# Patient Record
Sex: Female | Born: 1950
Health system: Southern US, Community
[De-identification: ages and names within clinical notes are randomized; demographics above are authoritative.]

## PROBLEM LIST (undated history)

## (undated) DIAGNOSIS — Z8542 Personal history of malignant neoplasm of other parts of uterus: Secondary | ICD-10-CM

## (undated) DIAGNOSIS — E079 Disorder of thyroid, unspecified: Secondary | ICD-10-CM

## (undated) DIAGNOSIS — E119 Type 2 diabetes mellitus without complications: Secondary | ICD-10-CM

## (undated) DIAGNOSIS — M1990 Unspecified osteoarthritis, unspecified site: Secondary | ICD-10-CM

## (undated) DIAGNOSIS — N3281 Overactive bladder: Secondary | ICD-10-CM

## (undated) DIAGNOSIS — N950 Postmenopausal bleeding: Secondary | ICD-10-CM

## (undated) DIAGNOSIS — E785 Hyperlipidemia, unspecified: Secondary | ICD-10-CM

## (undated) DIAGNOSIS — I10 Essential (primary) hypertension: Secondary | ICD-10-CM

## (undated) HISTORY — DX: Essential (primary) hypertension: I10

## (undated) HISTORY — PX: CYSTOSCOPY: SUR368

## (undated) HISTORY — DX: Postmenopausal bleeding: N95.0

## (undated) HISTORY — DX: Hyperlipidemia, unspecified: E78.5

## (undated) HISTORY — PX: COLONOSCOPY: SHX174

## (undated) HISTORY — DX: Disorder of thyroid, unspecified: E07.9

## (undated) HISTORY — DX: Personal history of malignant neoplasm of other parts of uterus: Z85.42

## (undated) HISTORY — DX: Overactive bladder: N32.81

## (undated) HISTORY — DX: Type 2 diabetes mellitus without complications: E11.9

---

## 2004-05-13 ENCOUNTER — Ambulatory Visit: Payer: Self-pay | Admitting: Cardiology

## 2005-08-29 ENCOUNTER — Ambulatory Visit: Payer: Self-pay | Admitting: Endocrinology

## 2006-05-04 ENCOUNTER — Ambulatory Visit: Payer: Self-pay | Admitting: Endocrinology

## 2006-06-23 ENCOUNTER — Encounter: Admission: RE | Admit: 2006-06-23 | Discharge: 2006-06-23 | Payer: Self-pay | Admitting: Endocrinology

## 2006-08-09 ENCOUNTER — Ambulatory Visit: Payer: Self-pay | Admitting: Endocrinology

## 2006-09-08 ENCOUNTER — Ambulatory Visit: Payer: Self-pay | Admitting: Endocrinology

## 2006-09-16 ENCOUNTER — Encounter: Payer: Self-pay | Admitting: Endocrinology

## 2006-09-16 DIAGNOSIS — I10 Essential (primary) hypertension: Secondary | ICD-10-CM

## 2006-10-23 ENCOUNTER — Ambulatory Visit: Payer: Self-pay | Admitting: Endocrinology

## 2006-10-23 LAB — CONVERTED CEMR LAB: TSH: 1.11 microintl units/mL (ref 0.35–5.50)

## 2006-10-24 ENCOUNTER — Encounter: Payer: Self-pay | Admitting: Endocrinology

## 2006-11-30 ENCOUNTER — Encounter: Payer: Self-pay | Admitting: Endocrinology

## 2006-12-01 ENCOUNTER — Telehealth: Payer: Self-pay | Admitting: Endocrinology

## 2007-01-24 ENCOUNTER — Ambulatory Visit: Payer: Self-pay | Admitting: Endocrinology

## 2007-01-24 DIAGNOSIS — E042 Nontoxic multinodular goiter: Secondary | ICD-10-CM

## 2007-01-24 DIAGNOSIS — E890 Postprocedural hypothyroidism: Secondary | ICD-10-CM

## 2007-01-25 ENCOUNTER — Encounter: Payer: Self-pay | Admitting: Endocrinology

## 2007-01-31 ENCOUNTER — Encounter: Payer: Self-pay | Admitting: Endocrinology

## 2007-04-24 ENCOUNTER — Ambulatory Visit: Payer: Self-pay | Admitting: Endocrinology

## 2008-03-13 ENCOUNTER — Ambulatory Visit: Payer: Self-pay | Admitting: Endocrinology

## 2008-03-13 LAB — CONVERTED CEMR LAB: TSH: 2.86 microintl units/mL (ref 0.35–5.50)

## 2009-01-29 ENCOUNTER — Encounter: Payer: Self-pay | Admitting: Endocrinology

## 2010-02-16 NOTE — Letter (Signed)
Summary: [facility]  Amy Leavy [facility]   Imported By: Sherian Rein 02/11/2009 11:02:54  _____________________________________________________________________  External Attachment:    Type:   Image     Comment:   External Document

## 2010-06-01 NOTE — Consult Note (Signed)
[facility]                          ENDOCRINOLOGY CONSULTATION   Hannah Barrett, Hannah Barrett                     MRN:          [id number]  DATE:09/08/2006                            DOB:          [birth year]    REASON FOR VISIT:  Follow up thyroid.   HISTORY OF PRESENT ILLNESS:  A 60 year old woman who is now 2-1/2 months  status post iodine-131 therapy for hyperthyroidism due to a multinodular  goiter.  She feels well except being slightly tired.   PAST MEDICAL HISTORY:  Hypertension.   REVIEW OF SYSTEMS:  Denies any change in he weight.   PHYSICAL EXAMINATION:  Blood pressure 141/73, heart rate 97, temperature  is 97.9, the weight is 231.  GENERAL:  No distress.  NECK:  Question of enlargement on the right side of the thyroid which is  slight if it is present at all.  NEUROLOGIC:  No tremor.   LABORATORY STUDIES:  September 08, 2006, TSH 0.23.   IMPRESSION:  1. Multinodular goiter.  2. Hyperthyroidism due to number 1, status post iodine-131 therapy      with some improvement in her TSH.   PLAN:  1. I have advised the patient that there is some improvement but she      is not euthyroid yet.  I also told her that in the setting of a      multinodular goiter I am uncertain if she would go from      hyperthyroid to euthyroid, or if she would go on to develop      hypothyroidism.  2. Return in about a month.     Sean A. Everardo All, MD  Electronically Signed    SAE/MedQ  DD: 09/12/2006  DT: 09/12/2006  Job #: [id number]   cc:   Fara Chute

## 2010-06-01 NOTE — Consult Note (Signed)
[facility]                          ENDOCRINOLOGY CONSULTATION   Hannah, Barrett                     MRN:          [id number]  DATE:08/09/2006                            DOB:          [birth year]    REASON FOR VISIT:  Followup thyroid.   HISTORY OF PRESENT ILLNESS:  A 60 year old woman who is now 6 weeks  status post Iodine-131 therapy for hyperthyroidism. She feels well  except for some fatigue.   PAST MEDICAL HISTORY:  Hypertension.   REVIEW OF SYSTEMS:  Denies any change in her weight.   PHYSICAL EXAMINATION:  VITAL SIGNS:  Blood pressure 149/85, heart rate  98, temperature 97.2, weight 235.  GENERAL:  No distress.  NECK:  Thyroid normal to my examination.   LABORATORY DATA:  Studies on August 09, 2006, free T4 normal at 0.9. TSH  slightly low at 0.14.   IMPRESSION:  1. Multi-nodular goiter.  2. Hyperthyroidism, due to multi-nodular goiter, persistent so far      after Iodine-131 therapy.   PLAN:  Return in a month.     Sean A. Everardo All, MD  Electronically Signed    SAE/MedQ  DD: 08/11/2006  DT: 08/13/2006  Job #: [id number]   cc:   Fara Chute

## 2010-06-04 NOTE — Consult Note (Signed)
[facility]                          ENDOCRINOLOGY CONSULTATION   Hannah, Barrett                     MRN:          [id number]  DATE:05/04/2006                            DOB:          22-Jan-1950    REASON FOR VISIT:  Follow up thyroid.   HISTORY OF PRESENT ILLNESS:  A 60 year old woman, who was found on  several occasions last year to have mild hyperthyroidism.  She states  she feels well except for fatigue.   FAMILY HISTORY:  She is uncertain about family history of thyroid  disease.   REVIEW OF SYSTEMS:  She has a lost a few pounds recently, but states she  is trying to lose.   PHYSICAL EXAMINATION:  VITAL SIGNS:  Blood pressure 129/74, heart rate  74, temperature is 97.1, the weight is 228.  GENERAL:  No distress.  SKIN:  Not diaphoretic.  NECK:  There is a question of fullness at the right lower pole, but I  cannot tell this for sure because any possible nodule there appears to  be substernal or subclavicular.   Laboratory studies forwarded by Dr. Reuel Boom:  On March 28, 2006, TSH  0.29.  On September 09, 2005, thyroid nuclear medicine scan shows a 24-hour  uptake low at 6%, and a question of increased activity at the right  lower pole.   IMPRESSION:  The most likely scenario is hereditary multinodular goiter  causing mild hyperthyroidism.   PLAN:  I discussed treatment options with her and told her that she  should repeat the scan and also have an ultrasound.  It is possible that  based on these results she may benefit from iodine-131 therapy.     Sean A. Everardo All, MD  Electronically Signed    SAE/MedQ  DD: 05/05/2006  DT: 05/05/2006  Job #: [id number]   cc:   Donzetta Sprung, M.D.

## 2010-06-04 NOTE — Consult Note (Signed)
[facility]                            ENDOCRINOLOGY CONSULTATION   Hannah Barrett, Hannah Barrett                     MRN:          [id number]  DATE:08/29/2005                            DOB:          Sep 03, 1950    REASON FOR REFERRAL:  Abnormal thyroid function studies.   HISTORY OF PRESENT ILLNESS:  [age]-year-old woman who states that in  February 2007, Dr. Neita Carp noted her to have, in her words, borderline  thyroid function studies, so he arranged for repeat studies in July.  Noting  these more abnormal, she was referred here.  Symptomatically, she feels well  except for minimal shortness of breath with exertion.   PAST MEDICAL HISTORY:  No previous history of thyroid disease.  She does  have a history of hypertension.   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   FAMILY HISTORY:  Negative for thyroid disease.   REVIEW OF SYSTEMS:  Denies the following:  Fever, weight gain, weight loss,  palpitations, itching and tremor.   PHYSICAL EXAMINATION:  VITAL SIGNS:  Blood pressure 128/76, heart rate 98.8.  Weight 246.  GENERAL:  No distress.  SKIN:  Not diaphoretic.  HEENT:  No proptosis.  No periorbital swelling.  NECK:  Thyroid is minimally and diffusely enlarged.  CHEST:  Clear to auscultation.  No respiratory distress.  CARDIOVASCULAR:  No JVD.  No edema.  Regular rate and rhythm.  No murmur.  NEUROLOGIC:  Alert and oriented.  Does not appear anxious or depressed and  there is no tremor.   LABORATORY DATA:  Laboratory studies forwarded by Dr. Neita Carp.  On August 16, 2005, TSH 0.12.   IMPRESSION:  1. Hyperthyroidism of uncertain etiology.  2. Minimal shortness of breath which is probably not related to #1.   PLAN:  1. We discussed the differential diagnosis and natural history of      hyperthyroidism, as well as the risks to her health.  2. Check thyroid nuclear medicine scan at [facility].  3. I  will let her know by phone about the results and will decide on a      plan from there.  4. I have told her that she could make an appointment with Dr. Neita Carp if      she wants to have the shortness of breath evaluated.                                   Sean A. Everardo All, MD   SAE/MedQ  DD:  08/30/2005  DT:  08/30/2005  Job #:  [id number]   cc:   Fara Chute

## 2011-02-11 ENCOUNTER — Ambulatory Visit: Payer: Self-pay | Admitting: Endocrinology

## 2014-01-13 ENCOUNTER — Encounter: Payer: BC Managed Care – PPO | Attending: "Endocrinology | Admitting: Nutrition

## 2014-01-13 VITALS — Ht 68.0 in | Wt 244.5 lb

## 2014-01-13 DIAGNOSIS — R7303 Prediabetes: Secondary | ICD-10-CM

## 2014-01-13 DIAGNOSIS — R7309 Other abnormal glucose: Secondary | ICD-10-CM | POA: Insufficient documentation

## 2014-01-13 DIAGNOSIS — E669 Obesity, unspecified: Secondary | ICD-10-CM

## 2014-01-13 DIAGNOSIS — Z713 Dietary counseling and surveillance: Secondary | ICD-10-CM | POA: Diagnosis not present

## 2014-01-13 NOTE — Patient Instructions (Addendum)
Goals:  Follow Diabetes Meal Plan as instructed  The Plate Method  Eat 3 meals about the same time daily.  Don't skip meals.  Limit carbohydrate intake to 30-45 grams carbohydrate/meal  Add lean protein foods to meals  Aim for 30 mins of physical activity three times per week.  Bring food record   Avoid snacks between meals  Cut out the salt shaker .Lose 5 lbs within 2 pounds.

## 2014-01-13 NOTE — Progress Notes (Signed)
  Medical Nutrition Therapy:  Appt start time: 1330 end time:  1430.   Assessment:  Primary concerns today:  Pre-Diabetes. Has had prediabetes for 5+ years. Lives with her husband. Metformin 500 mg once a day. Activity: Not much. Currently works fulltime as Web designer.    Preferred Learning Style:     No preference indicated   Learning Readiness:     Ready  Change in progress   MEDICATIONS: See list   DIETARY INTAKE:  Eats inconsistently. Skips breakfast usually. Uses a lot of salt when cooking and eating. Stress eats. Has late night snacks of cheese and other things. Drinks unsweet tea and water some. Not getting any exercise right now. Admits to not eating balanced meals.  Usual physical activity: ADL's  Estimated energy needs: 1500 calories 170 g carbohydrates 112 g protein 42 g fat  Progress Towards Goal(s):  In progress.   Nutritional Diagnosis:  NB-1.1 Food and nutrition-related knowledge deficit As related to Prediabetes.  As evidenced by A1C 6.3%.    Intervention:  Nutrition counseling on prediabetes, weight loss, meal planning and preventing complications from DM. Goals:  Follow Diabetes Meal Plan as instructed  The Plate Method  Eat 3 meals about the same time daily.  Don't skip meals.  Limit carbohydrate intake to 30-45 grams carbohydrate/meal  Add lean protein foods to meals  Aim for 30 mins of physical activity three times per week.  Bring food record   Avoid snacks between meals  Cut out the salt shaker .Lose 5 lbs within 2 months.   Teaching Method Utilized:  Visual Auditory Hands on  Handouts given during visit include: Living Well with Diabetes Carb Counting and Food Label handouts Meal Plan Card  Barriers to learning/adherence to lifestyle change: none  Demonstrated degree of understanding via:  Teach Back   Monitoring/Evaluation:  Dietary intake, exercise, meal planning, and body weight in 2 month(s).

## 2014-03-17 ENCOUNTER — Ambulatory Visit: Payer: BC Managed Care – PPO | Admitting: Nutrition

## 2014-05-30 LAB — HEMOGLOBIN A1C: Hgb A1c MFr Bld: 6 % (ref 4.0–6.0)

## 2014-07-08 ENCOUNTER — Ambulatory Visit: Payer: Self-pay | Admitting: Urology

## 2014-08-06 ENCOUNTER — Encounter: Payer: BLUE CROSS/BLUE SHIELD | Attending: General Practice | Admitting: Nutrition

## 2014-08-06 ENCOUNTER — Encounter: Payer: Self-pay | Admitting: Nutrition

## 2014-08-06 VITALS — Ht 69.0 in | Wt 259.4 lb

## 2014-08-06 DIAGNOSIS — R739 Hyperglycemia, unspecified: Secondary | ICD-10-CM

## 2014-08-06 DIAGNOSIS — E669 Obesity, unspecified: Secondary | ICD-10-CM

## 2014-08-06 NOTE — Progress Notes (Signed)
  Medical Nutrition Therapy:  Appt start time: 1200 end time:  1215.  Assessment:  Primary concerns today:  Pre-Diabetes follow up. She notes she lost her dad a few months ago and fell off the wagon. She notes she is eating more salads and some fruit. Still working on cutting out snacks which is her biggest issue along with portion control. Diet is low in fresh fruits, whole grains and high fiber foods. Drinking cranberry juice mostly but says it's for UTI. Retired in May. Not exercising much but willing to get started again. Has equipment at home but isn't using. Has gained some weight since last visit. Possible error weight of 244 lbs last visit. She think's it was 254 lbs.    Preferred Learning Style:     No preference indicated   Learning Readiness:     Ready  Change in progress   MEDICATIONS: See list   DIETARY INTAKE:  B) Raisin brain with 1% milk. Snack: none L) leftovers: broccoli, bbq chicken, Cranberry juice diet.  Snack: oreos 2 Cranberry juice D) LEfto over chicken, broccoli, baby limas and mac and cheese.   Eats inconsistently. Skips breakfast usually. Uses a lot of salt when cooking and eating. Stress eats. Has late night snacks of cheese and other things. Drinks unsweet tea and water some. Not getting any exercise right now. Admits to not eating balanced meals.  Usual physical activity: ADL's  Estimated energy needs: 1500 calories 170 g carbohydrates 112 g protein 42 g fat  Progress Towards Goal(s):  In progress.   Nutritional Diagnosis:  NB-1.1 Food and nutrition-related knowledge deficit As related to Prediabetes.  As evidenced by A1C 6.3%.    Intervention:  Nutrition counseling on prediabetes, weight loss, meal planning and preventing complications from DM. Need for increased physical activity, cutting out snacks and Cranberry juice Goals:  Follow Diabetes Meal Plan as instructed  Cut out Cranberry juice and only drink water  Increase low carb  vegetables to 2 svg per meal  Choose 1 piece of fruit with each meals.  Exercise 30 minutes 5 days per week.  Lose 1-2 lbs per week.   Lose 10 lbs by next visit.  Measure foods out for better portion control.  Teaching Method Utilized:  Visual Auditory Hands on  Handouts given during visit include: Living Well with Diabetes Carb Counting and Food Label handouts Meal Plan Card  Barriers to learning/adherence to lifestyle change: none  Demonstrated degree of understanding via:  Teach Back   Monitoring/Evaluation:  Dietary intake, exercise, meal planning, and body weight in 2 month(s).

## 2014-08-06 NOTE — Patient Instructions (Addendum)
Goals:  Follow Diabetes Meal Plan as instructed  Cut out Cranberry juice and only drink water  Increase low carb vegetables to 2 svg per meal  Choose 1 piece of fruit with each meals.  Exercise 30 minutes 5 days per week.  Cut out snacks!!   Lose 10 lbs by next visit.  Measure foods out for better portion control.

## 2014-10-08 LAB — HM DIABETES EYE EXAM

## 2014-10-16 ENCOUNTER — Other Ambulatory Visit: Payer: Self-pay | Admitting: "Endocrinology

## 2014-10-16 ENCOUNTER — Encounter: Payer: Self-pay | Admitting: "Endocrinology

## 2014-10-29 ENCOUNTER — Encounter (INDEPENDENT_AMBULATORY_CARE_PROVIDER_SITE_OTHER): Payer: Self-pay | Admitting: *Deleted

## 2014-11-06 ENCOUNTER — Encounter: Payer: Self-pay | Admitting: Nutrition

## 2014-11-06 ENCOUNTER — Encounter: Payer: BLUE CROSS/BLUE SHIELD | Attending: General Practice | Admitting: Nutrition

## 2014-11-06 VITALS — Ht 69.0 in | Wt 262.0 lb

## 2014-11-06 DIAGNOSIS — R739 Hyperglycemia, unspecified: Secondary | ICD-10-CM

## 2014-11-06 NOTE — Progress Notes (Signed)
  Medical Nutrition Therapy:  Appt start time: 1330 end time:  1400 Assessment:  Primary concerns today:  Pre-Diabetes follow up.  Some days are good .  Has been cutting down on using salt in cooking. Cut out carbonated drinks,  And drinking only unsweet tea. Portion sizes remain an issue. Still snacking at times. Physical activity: Doing remodeling of the house and that is limiting her time to exercise. Living with her daughter and family for right now due to remodeling. She is making better food choices overall. Doesn't drink enough water.  No weight loss; gained 1 lb.   Wt Readings from Last 3 Encounters:  11/06/14 262 lb (118.842 kg)  06/06/14 261 lb (118.389 kg)  08/06/14 259 lb 6.4 oz (117.663 kg)   Ht Readings from Last 3 Encounters:  11/06/14 '5\' 9"'$  (1.753 m)  06/06/14 '5\' 9"'$  (1.753 m)  08/06/14 '5\' 9"'$  (1.753 m)   Body mass index is 38.67 kg/(m^2).  Lab Results  Component Value Date   HGBA1C 6.0 05/30/2014   Preferred Learning Style:   No preference indicated   Learning Readiness:     Ready  Change in progress  MEDICATIONS: See list   DIETARY INTAKE:  B) Raisin brain with 1% milk or grape nuts 1/2 c. Snack: none L) can't remember what she ate D) Boneless pork chop, green beans,  Fried potatoes and onions, Unsweet tea  Usual physical activity: ADL's  Estimated energy needs: 1500 calories 170 g carbohydrates 112 g protein 42 g fat  Progress Towards Goal(s):  In progress.   Nutritional Diagnosis:  NB-1.1 Food and nutrition-related knowledge deficit As related to Prediabetes.  As evidenced by A1C 6.3%.    Intervention:  Nutrition counseling on prediabetes, weight loss, meal planning and preventing complications from DM. Need for increased physical activity, cutting out snacks and Cranberry juice Goals:  Follow Diabetes Meal Plan as instructed   Increase walking to 30 minutes three days a week.  Drink 5 bottles of water per day.  Put some reminder in the  house to remind of water.  Lose 1 lb per week.  \Teaching Method Utilized:  Visual Auditory Hands on  Handouts given during visit include: Living Well with Diabetes Carb Counting and Food Label handouts Meal Plan Card  Barriers to learning/adherence to lifestyle change: none  Demonstrated degree of understanding via:  Teach Back   Monitoring/Evaluation:  Dietary intake, exercise, meal planning, and body weight in 3 month(s).

## 2014-11-06 NOTE — Patient Instructions (Addendum)
Goals:  Follow Diabetes Meal Plan as instructed   Increase walking to 30 minutes three days a week.  Drink 5 bottles of water per day.  Put some reminder in the house to remind of water.  Lose 1 lb per week or 5 lbs by next visit.

## 2014-11-12 ENCOUNTER — Other Ambulatory Visit: Payer: Self-pay | Admitting: "Endocrinology

## 2014-11-12 DIAGNOSIS — E1165 Type 2 diabetes mellitus with hyperglycemia: Secondary | ICD-10-CM

## 2014-11-12 DIAGNOSIS — E032 Hypothyroidism due to medicaments and other exogenous substances: Secondary | ICD-10-CM

## 2014-11-12 DIAGNOSIS — IMO0002 Reserved for concepts with insufficient information to code with codable children: Secondary | ICD-10-CM

## 2014-11-12 DIAGNOSIS — E1169 Type 2 diabetes mellitus with other specified complication: Principal | ICD-10-CM

## 2014-11-18 ENCOUNTER — Encounter: Payer: Self-pay | Admitting: Adult Health

## 2014-11-18 ENCOUNTER — Ambulatory Visit (INDEPENDENT_AMBULATORY_CARE_PROVIDER_SITE_OTHER): Payer: BLUE CROSS/BLUE SHIELD | Admitting: Adult Health

## 2014-11-18 VITALS — BP 140/60 | HR 78 | Ht 69.0 in | Wt 270.0 lb

## 2014-11-18 DIAGNOSIS — N950 Postmenopausal bleeding: Secondary | ICD-10-CM

## 2014-11-18 HISTORY — DX: Postmenopausal bleeding: N95.0

## 2014-11-18 NOTE — Patient Instructions (Addendum)
Postmenopausal Bleeding Postmenopausal bleeding is any bleeding a woman has after she has entered into menopause. Menopause is the end of a woman's fertile years. After menopause, a woman no longer ovulates or has menstrual periods.  Postmenopausal bleeding can be caused by various things. Any type of postmenopausal bleeding, even if it appears to be a typical menstrual period, is concerning. This should be evaluated by your health care provider. Any treatment will depend on the cause of the bleeding. HOME CARE INSTRUCTIONS Monitor your condition for any changes. The following actions may help to alleviate any discomfort you are experiencing:  Avoid the use of tampons and douches as directed by your health care provider.  Change your pads frequently.  Get regular pelvic exams and Pap tests.  Keep all follow-up appointments for diagnostic tests as directed by your health care provider. SEEK MEDICAL CARE IF:   Your bleeding lasts more than 1 week.  You have abdominal pain.  You have bleeding with sexual intercourse. SEEK IMMEDIATE MEDICAL CARE IF:   You have a fever, chills, headache, dizziness, muscle aches, and bleeding.  You have severe pain with bleeding.  You are passing blood clots.  You have bleeding and need more than 1 pad an hour.  You feel faint. MAKE SURE YOU:  Understand these instructions.  Will watch your condition.  Will get help right away if you are not doing well or get worse.   This information is not intended to replace advice given to you by your health care provider. Make sure you discuss any questions you have with your health care provider.   Document Released: 04/13/2005 Document Revised: 10/24/2012 Document Reviewed: 08/02/2012 Elsevier Interactive Patient Education [employer]. Return 11/7 for gyn [place]

## 2014-11-18 NOTE — Progress Notes (Signed)
Subjective:     Patient ID: Hannah Barrett, female   DOB: Oct 11, 1950, 64 y.o.   MRN: [id number]  HPI Hannah Barrett is a 64 year old white female, married in for ?PMB, had seen some blood,not sure if vaginal or in urine is taking meds for UTI,but wanted to be checked, has seen Hannah Mends PA at [facility] and treated for UTI and has seen  Dr Diona Fanti for OAB.  Review of Systems +vaginal bleeding,has history of blood in urine and OAB,all other systems negative Reviewed past medical,surgical, social and family history. Reviewed medications and allergies.     Objective:   Physical Exam BP 140/60 mmHg  Pulse 78  Ht '5\' 9"'$  (1.753 m)  Wt 270 lb (122.471 kg)  BMI 39.85 kg/m2 Skin warm and dry.Pelvic: external genitalia is normal in appearance no lesions, vagina: has decreased color, moisture and rugae,urethra has no lesions or masses noted, cervix:smooth and deviated to the left, uterus: normal size, shape and contour, non tender, no masses felt, adnexa: no masses or tenderness noted. Bladder is non tender and no masses felt. Discussed will get [place] to assess endometrial lining and if thickened may need biopsy.Face time 20 minutes with 50% counseling.    Assessment:     PMB    Plan:     Return 11/7 for GYN US,will talk when results in Review handout on PMB

## 2014-11-24 ENCOUNTER — Telehealth: Payer: Self-pay | Admitting: Adult Health

## 2014-11-24 ENCOUNTER — Ambulatory Visit (INDEPENDENT_AMBULATORY_CARE_PROVIDER_SITE_OTHER): Payer: BLUE CROSS/BLUE SHIELD

## 2014-11-24 DIAGNOSIS — N950 Postmenopausal bleeding: Secondary | ICD-10-CM | POA: Diagnosis not present

## 2014-11-24 NOTE — Telephone Encounter (Signed)
Left message to call about [place]

## 2014-11-24 NOTE — Progress Notes (Signed)
PELVIC US TA/TV: homogenous anteverted uterus,normal lt ov, rt ov contains a  10 x 7.3 x 8.1 cm complex cyst w/mult thin septations,thick EEC 21m

## 2014-11-25 ENCOUNTER — Telehealth: Payer: Self-pay | Admitting: Adult Health

## 2014-11-25 DIAGNOSIS — N83201 Unspecified ovarian cyst, right side: Secondary | ICD-10-CM

## 2014-11-25 NOTE — Telephone Encounter (Signed)
Pt aware Hannah Barrett showed 15 mm endometrial lining and large complex cyst on right ovary, will check CA 125 today and get endometrial biopsy 11/10 at 11:45 with Dr Elonda Husky and if all normal will get F/U Hannah Barrett in 6-12 weeks for stability of cyst,as she has no pain with it.

## 2014-11-26 ENCOUNTER — Telehealth: Payer: Self-pay | Admitting: Adult Health

## 2014-11-26 DIAGNOSIS — N838 Other noninflammatory disorders of ovary, fallopian tube and broad ligament: Secondary | ICD-10-CM

## 2014-11-26 DIAGNOSIS — R971 Elevated cancer antigen 125 [CA 125]: Secondary | ICD-10-CM

## 2014-11-26 DIAGNOSIS — N950 Postmenopausal bleeding: Secondary | ICD-10-CM

## 2014-11-26 LAB — CA 125: CA 125: 238.4 U/mL — ABNORMAL HIGH (ref 0.0–38.1)

## 2014-11-26 NOTE — Telephone Encounter (Signed)
Pt aware of elevated CA 125 will get CT abd/pelvis with CONTRAST 11/10 at 7 pm at [facility]  Discussed with Dr Elonda Husky will cancel appt with him for tomorrow and decide F/U after CT results back

## 2014-11-27 ENCOUNTER — Ambulatory Visit (HOSPITAL_COMMUNITY)
Admission: RE | Admit: 2014-11-27 | Discharge: 2014-11-27 | Disposition: A | Discharge status: Home / Self Care | Payer: BLUE CROSS/BLUE SHIELD | Source: Ambulatory Visit | Attending: Adult Health | Admitting: Adult Health

## 2014-11-27 ENCOUNTER — Other Ambulatory Visit: Payer: BLUE CROSS/BLUE SHIELD | Admitting: Obstetrics & Gynecology

## 2014-11-27 DIAGNOSIS — N83201 Unspecified ovarian cyst, right side: Secondary | ICD-10-CM | POA: Diagnosis not present

## 2014-11-27 DIAGNOSIS — N200 Calculus of kidney: Secondary | ICD-10-CM | POA: Diagnosis not present

## 2014-11-27 DIAGNOSIS — N950 Postmenopausal bleeding: Secondary | ICD-10-CM

## 2014-11-27 DIAGNOSIS — K8020 Calculus of gallbladder without cholecystitis without obstruction: Secondary | ICD-10-CM | POA: Insufficient documentation

## 2014-11-27 DIAGNOSIS — R971 Elevated cancer antigen 125 [CA 125]: Secondary | ICD-10-CM

## 2014-11-27 DIAGNOSIS — N839 Noninflammatory disorder of ovary, fallopian tube and broad ligament, unspecified: Secondary | ICD-10-CM | POA: Diagnosis present

## 2014-11-27 DIAGNOSIS — N838 Other noninflammatory disorders of ovary, fallopian tube and broad ligament: Secondary | ICD-10-CM

## 2014-11-27 LAB — POCT I-STAT CREATININE: CREATININE: 0.6 mg/dL (ref 0.44–1.00)

## 2014-11-27 MED ORDER — IOHEXOL 300 MG/ML  SOLN
100.0000 mL | Freq: Once | INTRAMUSCULAR | Status: AC | PRN
  Administered 2014-11-27: 100 mL via INTRAVENOUS

## 2014-11-28 ENCOUNTER — Telehealth: Payer: Self-pay | Admitting: Adult Health

## 2014-11-28 NOTE — Telephone Encounter (Signed)
Pt aware of CT, and has appt 11/14 at 1:45 pm with Dr Everitt Amber at [facility]

## 2014-12-01 ENCOUNTER — Ambulatory Visit: Payer: BLUE CROSS/BLUE SHIELD | Attending: Gynecologic Oncology | Admitting: Gynecologic Oncology

## 2014-12-01 ENCOUNTER — Encounter: Payer: Self-pay | Admitting: Gynecologic Oncology

## 2014-12-01 VITALS — BP 136/62 | HR 83 | Temp 98.3°F | Resp 19 | Ht 69.0 in | Wt 270.7 lb

## 2014-12-01 DIAGNOSIS — Z6841 Body Mass Index (BMI) 40.0 and over, adult: Secondary | ICD-10-CM | POA: Diagnosis not present

## 2014-12-01 DIAGNOSIS — R971 Elevated cancer antigen 125 [CA 125]: Secondary | ICD-10-CM

## 2014-12-01 DIAGNOSIS — E119 Type 2 diabetes mellitus without complications: Secondary | ICD-10-CM

## 2014-12-01 DIAGNOSIS — N950 Postmenopausal bleeding: Secondary | ICD-10-CM | POA: Diagnosis not present

## 2014-12-01 DIAGNOSIS — G4733 Obstructive sleep apnea (adult) (pediatric): Secondary | ICD-10-CM | POA: Insufficient documentation

## 2014-12-01 DIAGNOSIS — N838 Other noninflammatory disorders of ovary, fallopian tube and broad ligament: Secondary | ICD-10-CM | POA: Insufficient documentation

## 2014-12-01 DIAGNOSIS — E039 Hypothyroidism, unspecified: Secondary | ICD-10-CM | POA: Insufficient documentation

## 2014-12-01 DIAGNOSIS — N839 Noninflammatory disorder of ovary, fallopian tube and broad ligament, unspecified: Secondary | ICD-10-CM | POA: Insufficient documentation

## 2014-12-01 DIAGNOSIS — E118 Type 2 diabetes mellitus with unspecified complications: Secondary | ICD-10-CM | POA: Insufficient documentation

## 2014-12-01 NOTE — Patient Instructions (Signed)
Preparing for your Surgery  Plan for surgery on December 1 with Dr. Denman George.  You will be scheduled for a robotic total hysterectomy, bilateral salpingo-oophorectomy, possible staging.  Pre-operative Testing -You will receive a phone call from presurgical testing at [facility] to arrange for a pre-operative testing appointment before your surgery.  This appointment normally occurs one to two weeks before your scheduled surgery.   -Bring your insurance card, copy of an advanced directive if applicable, medication list  -At that visit, you will be asked to sign a consent for a possible blood transfusion in case a transfusion becomes necessary during surgery.  The need for a blood transfusion is rare but having consent is a necessary part of your care.     -You should not be taking blood thinners or aspirin at least ten days prior to surgery unless instructed by your surgeon.  Day Before Surgery at [place] will be asked to take in only clear liquids the day before surgery.  Examples of clear liquids include broths, jello, and clear juices.  Avoid carbonated beverages.  You will be advised to have nothing to eat or drink after midnight the evening before.    Your role in recovery Your role is to become active as soon as directed by your doctor, while still giving yourself time to heal.  Rest when you feel tired. You will be asked to do the following in order to speed your recovery:  - Cough and breathe deeply. This helps toclear and expand your lungs and can prevent pneumonia. You may be given a spirometer to practice deep breathing. A staff member will show you how to use the spirometer. - Do mild physical activity. Walking or moving your legs help your circulation and body functions return to normal. A staff member will help you when you try to walk and will provide you with simple exercises. Do not try to get up or walk alone the first time. - Actively manage your pain.  Managing your pain lets you move in comfort. We will ask you to rate your pain on a scale of zero to 10. It is your responsibility to tell your doctor or nurse where and how much you hurt so your pain can be treated.  Special Considerations -If you are diabetic, you may be placed on insulin after surgery to have closer control over your blood sugars to promote healing and recovery.  This does not mean that you will be discharged on insulin.  If applicable, your oral antidiabetics will be resumed when you are tolerating a solid diet.  -Your final pathology results from surgery should be available by the Friday after surgery and the results will be relayed to you when available.  Blood Transfusion Information WHAT IS A BLOOD TRANSFUSION? A transfusion is the replacement of blood or some of its parts. Blood is made up of multiple cells which provide different functions.  Red blood cells carry oxygen and are used for blood loss replacement.  White blood cells fight against infection.  Platelets control bleeding.  Plasma helps clot blood.  Other blood products are available for specialized needs, such as hemophilia or other clotting disorders. BEFORE THE TRANSFUSION  Who gives blood for transfusions?   You may be able to donate blood to be used at a later date on yourself (autologous donation).  Relatives can be asked to donate blood. This is generally not any safer than if you have received blood from a stranger. The same  precautions are taken to ensure safety when a relative's blood is donated.  Healthy volunteers who are fully evaluated to make sure their blood is safe. This is blood bank blood. Transfusion therapy is the safest it has ever been in the practice of medicine. Before blood is taken from a donor, a complete history is taken to make sure that person has no history of diseases nor engages in risky social behavior (examples are intravenous drug use or sexual activity with multiple  partners). The donor's travel history is screened to minimize risk of transmitting infections, such as malaria. The donated blood is tested for signs of infectious diseases, such as HIV and hepatitis. The blood is then tested to be sure it is compatible with you in order to minimize the chance of a transfusion reaction. If you or a relative donates blood, this is often done in anticipation of surgery and is not appropriate for emergency situations. It takes many days to process the donated blood. RISKS AND COMPLICATIONS Although transfusion therapy is very safe and saves many lives, the main dangers of transfusion include:   Getting an infectious disease.  Developing a transfusion reaction. This is an allergic reaction to something in the blood you were given. Every precaution is taken to prevent this. The decision to have a blood transfusion has been considered carefully by your caregiver before blood is given. Blood is not given unless the benefits outweigh the risks.

## 2014-12-01 NOTE — Progress Notes (Signed)
Consult Note: Gyn-Onc  Consult was requested by Dr. Laurann Montana for the evaluation of Ramonica Grigg 64 y.o. female with a 9cm complex ovarian mass  CC:  Chief Complaint  Patient presents with  . Ovarian Mass, PMB    New patient    Assessment/Plan:  Ms. Kaylie Ritter  is a 64 y.o.  year old with a complex cystic and solid ovarian mass (right) and elevated CA 125 and postmenopausal bleeding.  I discussed with the patient that I am uncertain as to whether or not this mass is malignant. There are reassuring features on imaging (the smooth regular nature of the mass, no signs of metastatic disease) however, the elevation in CA 125 is concerning for malignancy (though not diagnostic of this).   We were unable to perform uterine sampling in the office today.  Therefore, we will plan on a robotic assisted total hysterectomy, BSO and frozen section. If malignancy is identified in the ovary or endometrium, we will perform the necessary staging procedures of omentectomy/lymphadenectomy.  I discussed that she is at increased risk for perioperative complication due to her weight (morbid obesity, BMI40 kg/m2), and her diabetes and OSA. I discussed that tight perioperative glucose control is important to minimize perioperative complications. Operative complications that were discussed include  bleeding, infection, damage to internal organs (such as bladder,ureters, bowels), blood clot, reoperation and rehospitalization.   HPI: Raylynn Hersh is a very pleasant 64 year old G1P1 who is seen in consultation at the request of Derrek Monaco (NP) for a complex right ovarian cyst. The patient reports having some bloody and mucoid discharge for approximately 1 year. She ignored it after her father died in Apr 10, 2014, but allerted her physicians to this in October, 2016. At that time they performed an [place] of thepelvis on 11/24/14 which showed an 8.7cm uterus with 42m endometrial stripe and a 10cm right ovarian cyst  with thin septations. The left ovary was normal.  The CT of the abdomen and pelvis on 11/27/14 featured a 9.2cm complex right ovarian cyst with mural nodularity. There was no ascites or peritoneal nodularity or adenopathy.  CA 125 on 11/25/14 was elevated at 238.4 U/mL.   She is otherwise asymptomatic from this cyst. She denies abdominal pain or bloating. Her only symptom is postmenopausal bleeding/spotting.  She has a medical history positive for morbid obesity, diabetes mellitus (most recent HbA1c was 6.5), she has HTN, OSA and hypothyroidism. She has had no prior surgeries.  Current Meds:  Outpatient Encounter Prescriptions as of 12/01/2014  Medication Sig  . amLODipine (NORVASC) 5 MG tablet Take 5 mg by mouth daily.  .Marland Kitchenlevothyroxine (SYNTHROID, LEVOTHROID) 112 MCG tablet Take 112 mcg by mouth every morning.  .Marland Kitchenlisinopril (PRINIVIL,ZESTRIL) 10 MG tablet Take 10 mg by mouth daily.  . metformin (FORTAMET) 500 MG (OSM) 24 hr tablet Take by mouth daily with breakfast.  . Multiple Vitamin (MULTIVITAMIN) tablet Take 1 tablet by mouth daily.  . [DISCONTINUED] amoxicillin (AMOXIL) 500 MG capsule 500 mg 2 (two) times daily.    No facility-administered encounter medications on file as of 12/01/2014.    Allergy: No Known Allergies  Social Hx:   Social History   Social History  . Marital Status: Married    Spouse Name: N/A  . Number of Children: N/A  . Years of Education: N/A   Occupational History  . Not on file.   Social History Main Topics  . Smoking status: Former Smoker    Types: Cigarettes  . Smokeless tobacco:  Never Used  . Alcohol Use: No  . Drug Use: No  . Sexual Activity: Not Currently    Birth Control/ Protection: Post-menopausal   Other Topics Concern  . Not on file   Social History Narrative    Past Surgical Hx: History reviewed. No pertinent past surgical history.  Past Medical Hx:  Past Medical History  Diagnosis Date  . Diabetes mellitus without  complication ([place])   . Hyperlipidemia   . Hypertension   . Thyroid disease   . Overactive bladder   . PMB (postmenopausal bleeding) 11/18/2014    Past Gynecological History:  SVD x 1  No LMP recorded. Patient is postmenopausal.  Family Hx:  Family History  Problem Relation Age of Onset  . Congestive Heart Failure Mother   . Arthritis Father   . Other Sister     brain injury after fall  . Cancer Sister     multiple mylemoma  . Obesity Daughter   . Diabetes Maternal Grandmother   . Parkinson's disease Paternal Grandfather     Review of Systems:  Constitutional  Feels well,    ENT Normal appearing ears and nares bilaterally Skin/Breast  No rash, sores, jaundice, itching, dryness Cardiovascular  No chest pain, shortness of breath, or edema  Pulmonary  No cough or wheeze.  Gastro Intestinal  No nausea, vomitting, or diarrhoea. No bright red blood per rectum, no abdominal pain, change in bowel movement, or constipation.  Genito Urinary  No frequency, urgency, dysuria, + postmenopausal bleeding Musculo Skeletal  No myalgia, arthralgia, joint swelling or pain  Neurologic  No weakness, numbness, change in gait,  Psychology  No depression, anxiety, insomnia.   Vitals:  Blood pressure 136/62, pulse 83, temperature 98.3 F (36.8 C), temperature source Oral, resp. rate 19, height '5\' 9"'$  (1.753 m), weight 270 lb 11.2 oz (122.789 kg), SpO2 98 %.  Physical Exam: WD in NAD Neck  Supple NROM, without any enlargements.  Lymph Node Survey No cervical supraclavicular or inguinal adenopathy Cardiovascular  Pulse normal rate, regularity and rhythm. S1 and S2 normal.  Lungs  Clear to auscultation bilateraly, without wheezes/crackles/rhonchi. Good air movement.  Skin  No rash/lesions/breakdown  Psychiatry  Alert and oriented to person, place, and time  Abdomen  Normoactive bowel sounds, abdomen soft, non-tender and obese without evidence of hernia.  Back No CVA  tenderness Genito Urinary  Vulva/vagina: Normal external female genitalia.   No lesions. No discharge or bleeding.  Bladder/urethra:  No lesions or masses, well supported bladder  Vagina: normal  Cervix:Unable to visualize secondary to long vaginal and obesity.  Uterus: Small, mobile, no parametrial involvement or nodularity.  Adnexa: unable to appreciate mass due to body habitus.. Rectal  Good tone, no masses no cul de sac nodularity.  Extremities  No bilateral cyanosis, clubbing or edema.   Donaciano Eva, MD  12/01/2014, 4:22 PM

## 2014-12-02 ENCOUNTER — Ambulatory Visit (INDEPENDENT_AMBULATORY_CARE_PROVIDER_SITE_OTHER): Payer: BLUE CROSS/BLUE SHIELD | Admitting: Urology

## 2014-12-02 DIAGNOSIS — R310 Gross hematuria: Secondary | ICD-10-CM | POA: Diagnosis not present

## 2014-12-02 DIAGNOSIS — N3281 Overactive bladder: Secondary | ICD-10-CM | POA: Diagnosis not present

## 2014-12-02 LAB — HEMOGLOBIN A1C: Hgb A1c MFr Bld: 6.1 % — AB (ref 4.0–6.0)

## 2014-12-08 ENCOUNTER — Ambulatory Visit (INDEPENDENT_AMBULATORY_CARE_PROVIDER_SITE_OTHER): Payer: BLUE CROSS/BLUE SHIELD | Admitting: "Endocrinology

## 2014-12-08 ENCOUNTER — Encounter: Payer: Self-pay | Admitting: "Endocrinology

## 2014-12-08 VITALS — BP 129/73 | HR 64 | Ht 69.0 in | Wt 271.0 lb

## 2014-12-08 DIAGNOSIS — I10 Essential (primary) hypertension: Secondary | ICD-10-CM

## 2014-12-08 DIAGNOSIS — E038 Other specified hypothyroidism: Secondary | ICD-10-CM

## 2014-12-08 DIAGNOSIS — E118 Type 2 diabetes mellitus with unspecified complications: Secondary | ICD-10-CM

## 2014-12-08 NOTE — Progress Notes (Signed)
Subjective:    Patient ID: Hannah Barrett, female    DOB: 12-18-1950, PCP Manon Hilding, MD   Past Medical History  Diagnosis Date  . Diabetes mellitus without complication ([place])   . Hyperlipidemia   . Hypertension   . Thyroid disease   . Overactive bladder   . PMB (postmenopausal bleeding) 11/18/2014   History reviewed. No pertinent past surgical history. Social History   Social History  . Marital Status: Married    Spouse Name: N/A  . Number of Children: N/A  . Years of Education: N/A   Social History Main Topics  . Smoking status: Former Smoker    Types: Cigarettes  . Smokeless tobacco: Never Used  . Alcohol Use: No  . Drug Use: No  . Sexual Activity: Not Currently    Birth Control/ Protection: Post-menopausal   Other Topics Concern  . None   Social History Narrative   Outpatient Encounter Prescriptions as of 12/08/2014  Medication Sig  . acetaminophen (TYLENOL) 500 MG tablet Take 1,000 mg by mouth every 6 (six) hours as needed for mild pain or headache.  Marland Kitchen amLODipine (NORVASC) 5 MG tablet Take 5 mg by mouth daily.  Marland Kitchen levothyroxine (SYNTHROID, LEVOTHROID) 112 MCG tablet Take 112 mcg by mouth every morning.  Marland Kitchen lisinopril (PRINIVIL,ZESTRIL) 10 MG tablet Take 10 mg by mouth daily.  . metformin (FORTAMET) 500 MG (OSM) 24 hr tablet Take by mouth daily with breakfast.  . Multiple Vitamin (MULTIVITAMIN) tablet Take 1 tablet by mouth daily.   No facility-administered encounter medications on file as of 12/08/2014.   ALLERGIES: No Known Allergies VACCINATION STATUS:  There is no immunization history on file for this patient.  HPI  64 yr old with hx of RAI induced hypothyroidism, and controlled type 2 DM. She is here for follow-up for hypothyroidism controlled type 2 diabetes and hypertension. Since her last visit, she was diagnosed with ovarian cyst and is scheduled to have total hysterectomy and bilateral salpingo-oophorectomy. Her last a1c is better at 6 %,  she is tolerating MTF. She is regaining weight.  Review of Systems  Constitutional:  +weight gain,  no subjective hyperthermia/hypothermia Eyes: no blurry vision, no xerophthalmia ENT: no sore throat, no nodules palpated in throat, no dysphagia/odynophagia, no hoarseness Cardiovascular: no CP/SOB/palpitations/leg swelling Respiratory: no cough/SOB Gastrointestinal: no N/V/D/C Musculoskeletal: no muscle/joint aches Skin: no rashes Neurological: no tremors/numbness/tingling/dizziness Psychiatric: no depression/anxiety  Objective:    BP 129/73 mmHg  Pulse 64  Ht '5\' 9"'$  (1.753 m)  Wt 271 lb (122.925 kg)  BMI 40.00 kg/m2  SpO2 97%  Wt Readings from Last 3 Encounters:  12/08/14 271 lb (122.925 kg)  12/01/14 270 lb 11.2 oz (122.789 kg)  11/18/14 270 lb (122.471 kg)    Physical Exam  Constitutional: overweight, in NAD Eyes: PERRLA, EOMI, no exophthalmos ENT: moist mucous membranes, no thyromegaly, no cervical lymphadenopathy Cardiovascular: RRR, No MRG Respiratory: CTA B Gastrointestinal: abdomen soft, NT, ND, BS+ Musculoskeletal: no deformities, strength intact in all 4 Skin: moist, warm, no rashes Neurological: no tremor with outstretched hands, DTR normal in all 4  Results for orders placed or performed in visit on 12/08/14  Hemoglobin A1c  Result Value Ref Range   Hgb A1c MFr Bld 6.1 (A) 4.0 - 6.0 %   Complete Blood Count (Most recent): No results found for: WBC, HGB, HCT, MCV, PLT Chemistry (most recent): Lab Results  Component Value Date   CREATININE 0.60 11/27/2014   Diabetic Labs (most recent): Lab Results  Component Value Date   HGBA1C 6.1* 12/02/2014   HGBA1C 6.0 05/30/2014    On 12/02/2014 her labs showed TSH 3.67, free T4 1 0.34, hemoglobin A1c 6.1%  Assessment & Plan:   1. Other specified hypothyroidism: -She is clinically euthyroid. -Her TFTs are c/w appropriate-replacement . Continue LT4   169mg po qam.  - We discussed about correct intake of  levothyroxine, at fasting, with water, separated by at least 30 minutes from breakfast, and separated by more than 4 hours from calcium, iron, multivitamins, acid reflux medications (PPIs). -Patient is made aware of the fact that thyroid hormone replacement is needed for life, dose to be adjusted by periodic monitoring of thyroid function tests.  2. Type 2 diabetes mellitus with complication, without long-term current use of insulin ([place] -continue MTF '500mg'$  po qday. dietary advice given. a1c is better at 6%. she is following wth PJearld Fenton CDE for DM education, given that she is gaining weight. She declined an offer of Qsymia for weight control. she has no contraindications to its use - Hemoglobin A1c  3. Essential hypertension, benign -Continue amlodipine 5 mg by mouth daily along with lisinopril 10 mg by mouth daily. 4. Osteoporosis: Continue Alendronate for Osteoporosis.    - I advised patient to maintain close follow up with their PCP for primary care needs. Follow up plan: Return in about 3 months (around 03/10/2015) for diabetes, high blood pressure, underactive thyroid.  GGlade Lloyd MD Phone: [phone] Fax: [phone]  12/08/2014, 6:39 PM

## 2014-12-08 NOTE — Patient Instructions (Addendum)
Hannah Barrett  12/08/2014   Your procedure is scheduled on: Thursday 12/18/2014  Report to [facility] Main  Entrance take Hannah Barrett  elevators to 3rd floor to  Hannah Barrett at  [place]  AM.  Call this number if you have problems the morning of surgery [phone]   Remember: ONLY 1 PERSON MAY GO WITH YOU TO SHORT STAY TO GET  READY MORNING OF Hannah Barrett.   Do not eat food or drink liquids :After Midnight.   FOLLOW A CLEAR LIQUID DIET THE DAY BEFORE SURGERY-ALL DAY(WED. 12/17/14)-SEE BELOW!   Take these medicines the morning of surgery with A SIP OF WATER: Amlodipine, Levothyroid              DO NOT TAKE ANY DIABETIC MEDICATIONS DAY OF YOUR SURGERY!                               You may not have any metal on your body including hair pins and              piercings  Do not wear jewelry, make-up, lotions, powders or perfumes, deodorant             Do not wear nail polish.  Do not shave  48 hours prior to surgery.              Men may shave face and neck.   Do not bring valuables to the hospital. [place].  Contacts, dentures or bridgework may not be worn into surgery.  Leave suitcase in the car. After surgery it may be brought to your room.     Patients discharged the day of surgery will not be allowed to drive home.  Name and phone number of your driver:  Special Instructions: N/A              Please read over the following fact sheets you were given: _____________________________________________________________________                CLEAR LIQUID DIET   Foods Allowed                                                                     Foods Excluded  Coffee and tea, regular and decaf                             liquids that you cannot  Plain Jell-O in any flavor                                             see through such as: Fruit ices (not with fruit pulp)  milk, soups, orange juice  Iced Popsicles                                    All solid food Carbonated beverages, regular and diet                                    Cranberry, grape and apple juices Sports drinks like Gatorade Lightly seasoned clear broth or consume(fat free) Sugar, honey syrup  Sample Menu Breakfast                                Lunch                                     Supper Cranberry juice                    Beef broth                            Chicken broth Jell-O                                     Grape juice                           Apple juice Coffee or tea                        Jell-O                                      Popsicle                                                Coffee or tea                        Coffee or tea  _____________________________________________________________________  [facility] - Preparing for Surgery Before surgery, you can play an important role.  Because skin is not sterile, your skin needs to be as free of germs as possible.  You can reduce the number of germs on your skin by washing with CHG (chlorahexidine gluconate) soap before surgery.  CHG is an antiseptic cleaner which kills germs and bonds with the skin to continue killing germs even after washing. Please DO NOT use if you have an allergy to CHG or antibacterial soaps.  If your skin becomes reddened/irritated stop using the CHG and inform your nurse when you arrive at Short Stay. Do not shave (including legs and underarms) for at least 48 hours prior to the first CHG shower.  You may shave your face/neck. Please follow these instructions carefully:  1.  Shower with CHG Soap the night before surgery and the  morning of Surgery.  2.  If you choose to wash  your hair, wash your hair first as usual with your  normal  shampoo.  3.  After you shampoo, rinse your hair and body thoroughly to remove the  shampoo.                           4.  Use CHG as you would any other liquid  soap.  You can apply chg directly  to the skin and wash                       Gently with a scrungie or clean washcloth.  5.  Apply the CHG Soap to your body ONLY FROM THE NECK DOWN.   Do not use on face/ open                           Wound or open sores. Avoid contact with eyes, ears mouth and genitals (private parts).                       Wash face,  Genitals (private parts) with your normal soap.             6.  Wash thoroughly, paying special attention to the area where your surgery  will be performed.  7.  Thoroughly rinse your body with warm water from the neck down.  8.  DO NOT shower/wash with your normal soap after using and rinsing off  the CHG Soap.                9.  Pat yourself dry with a clean towel.            10.  Wear clean pajamas.            11.  Place clean sheets on your bed the night of your first shower and do not  sleep with pets. Day of Surgery : Do not apply any lotions/deodorants the morning of surgery.  Please wear clean clothes to the hospital/surgery center.  FAILURE TO FOLLOW THESE INSTRUCTIONS MAY RESULT IN THE CANCELLATION OF YOUR SURGERY PATIENT SIGNATURE_________________________________  NURSE SIGNATURE__________________________________  ________________________________________________________________________   [name]  An incentive spirometer is a tool that can help keep your lungs clear and active. This tool measures how well you are filling your lungs with each breath. Taking long deep breaths may help reverse or decrease the chance of developing breathing (pulmonary) problems (especially infection) following:  A long period of time when you are unable to move or be active. BEFORE THE PROCEDURE   If the spirometer includes an indicator to show your best effort, your nurse or respiratory therapist will set it to a desired goal.  If possible, sit up straight or lean slightly forward. Try not to slouch.  Hold the incentive spirometer  in an upright position. INSTRUCTIONS FOR USE   Sit on the edge of your bed if possible, or sit up as far as you can in bed or on a chair.  Hold the incentive spirometer in an upright position.  Breathe out normally.  Place the mouthpiece in your mouth and seal your lips tightly around it.  Breathe in slowly and as deeply as possible, raising the piston or the ball toward the top of the column.  Hold your breath for 3-5 seconds or for as long as possible. Allow the piston or ball to fall to the bottom  of the column.  Remove the mouthpiece from your mouth and breathe out normally.  Rest for a few seconds and repeat Steps 1 through 7 at least 10 times every 1-2 hours when you are awake. Take your time and take a few normal breaths between deep breaths.  The spirometer may include an indicator to show your best effort. Use the indicator as a goal to work toward during each repetition.  After each set of 10 deep breaths, practice coughing to be sure your lungs are clear. If you have an incision (the cut made at the time of surgery), support your incision when coughing by placing a pillow or rolled up towels firmly against it. Once you are able to get out of bed, walk around indoors and cough well. You may stop using the incentive spirometer when instructed by your caregiver.  RISKS AND COMPLICATIONS  Take your time so you do not get dizzy or light-headed.  If you are in pain, you may need to take or ask for pain medication before doing incentive spirometry. It is harder to take a deep breath if you are having pain. AFTER USE  Rest and breathe slowly and easily.  It can be helpful to keep track of a log of your progress. Your caregiver can provide you with a simple table to help with this. If you are using the spirometer at home, follow these instructions: [place] IF:   You are having difficultly using the spirometer.  You have trouble using the spirometer as often as  instructed.  Your pain medication is not giving enough relief while using the spirometer.  You develop fever of 100.5 F (38.1 C) or higher. SEEK IMMEDIATE MEDICAL CARE IF:   You cough up bloody sputum that had not been present before.  You develop fever of 102 F (38.9 C) or greater.  You develop worsening pain at or near the incision site. MAKE SURE YOU:   Understand these instructions.  Will watch your condition.  Will get help right away if you are not doing well or get worse. Document Released: 05/16/2006 Document Revised: 03/28/2011 Document Reviewed: 07/17/2006 ExitCare Patient Information 2014 ExitCare, [place].   ________________________________________________________________________  WHAT IS A BLOOD TRANSFUSION? Blood Transfusion Information  A transfusion is the replacement of blood or some of its parts. Blood is made up of multiple cells which provide different functions.  Red blood cells carry oxygen and are used for blood loss replacement.  White blood cells fight against infection.  Platelets control bleeding.  Plasma helps clot blood.  Other blood products are available for specialized needs, such as hemophilia or other clotting disorders. BEFORE THE TRANSFUSION  Who gives blood for transfusions?   Healthy volunteers who are fully evaluated to make sure their blood is safe. This is blood bank blood. Transfusion therapy is the safest it has ever been in the practice of medicine. Before blood is taken from a donor, a complete history is taken to make sure that person has no history of diseases nor engages in risky social behavior (examples are intravenous drug use or sexual activity with multiple partners). The donor's travel history is screened to minimize risk of transmitting infections, such as malaria. The donated blood is tested for signs of infectious diseases, such as HIV and hepatitis. The blood is then tested to be sure it is compatible with you in  order to minimize the chance of a transfusion reaction. If you or a relative donates blood, this is  often done in anticipation of surgery and is not appropriate for emergency situations. It takes many days to process the donated blood. RISKS AND COMPLICATIONS Although transfusion therapy is very safe and saves many lives, the main dangers of transfusion include:   Getting an infectious disease.  Developing a transfusion reaction. This is an allergic reaction to something in the blood you were given. Every precaution is taken to prevent this. The decision to have a blood transfusion has been considered carefully by your caregiver before blood is given. Blood is not given unless the benefits outweigh the risks. AFTER THE TRANSFUSION  Right after receiving a blood transfusion, you will usually feel much better and more energetic. This is especially true if your red blood cells have gotten low (anemic). The transfusion raises the level of the red blood cells which carry oxygen, and this usually causes an energy increase.  The nurse administering the transfusion will monitor you carefully for complications. HOME CARE INSTRUCTIONS  No special instructions are needed after a transfusion. You may find your energy is better. Speak with your caregiver about any limitations on activity for underlying diseases you may have. SEEK MEDICAL CARE IF:   Your condition is not improving after your transfusion.  You develop redness or irritation at the intravenous (IV) site. SEEK IMMEDIATE MEDICAL CARE IF:  Any of the following symptoms occur over the next 12 hours:  Shaking chills.  You have a temperature by mouth above 102 F (38.9 C), not controlled by medicine.  Chest, back, or muscle pain.  People around you feel you are not acting correctly or are confused.  Shortness of breath or difficulty breathing.  Dizziness and fainting.  You get a rash or develop hives.  You have a decrease in urine  output.  Your urine turns a dark color or changes to pink, red, or brown. Any of the following symptoms occur over the next 10 days:  You have a temperature by mouth above 102 F (38.9 C), not controlled by medicine.  Shortness of breath.  Weakness after normal activity.  The white part of the eye turns yellow (jaundice).  You have a decrease in the amount of urine or are urinating less often.  Your urine turns a dark color or changes to pink, red, or brown. Document Released: 01/01/2000 Document Revised: 03/28/2011 Document Reviewed: 08/20/2007 [facility] Patient Information 2014 [place], [place].  _______________________________________________________________________

## 2014-12-10 ENCOUNTER — Encounter (HOSPITAL_COMMUNITY)
Admission: RE | Admit: 2014-12-10 | Discharge: 2014-12-10 | Disposition: A | Discharge status: Home / Self Care | Payer: BLUE CROSS/BLUE SHIELD | Source: Ambulatory Visit | Attending: Gynecologic Oncology | Admitting: Gynecologic Oncology

## 2014-12-10 ENCOUNTER — Encounter (HOSPITAL_COMMUNITY): Payer: Self-pay

## 2014-12-10 DIAGNOSIS — N839 Noninflammatory disorder of ovary, fallopian tube and broad ligament, unspecified: Secondary | ICD-10-CM | POA: Diagnosis not present

## 2014-12-10 DIAGNOSIS — Z01818 Encounter for other preprocedural examination: Secondary | ICD-10-CM | POA: Diagnosis present

## 2014-12-10 HISTORY — DX: Unspecified osteoarthritis, unspecified site: M19.90

## 2014-12-10 LAB — CBC WITH DIFFERENTIAL/PLATELET
Basophils Absolute: 0.1 10*3/uL (ref 0.0–0.1)
Basophils Relative: 1 %
EOS ABS: 0.2 10*3/uL (ref 0.0–0.7)
Eosinophils Relative: 2 %
HEMATOCRIT: 39.3 % (ref 36.0–46.0)
HEMOGLOBIN: 13 g/dL (ref 12.0–15.0)
LYMPHS ABS: 2 10*3/uL (ref 0.7–4.0)
Lymphocytes Relative: 21 %
MCH: 30.2 pg (ref 26.0–34.0)
MCHC: 33.1 g/dL (ref 30.0–36.0)
MCV: 91.2 fL (ref 78.0–100.0)
MONO ABS: 0.6 10*3/uL (ref 0.1–1.0)
MONOS PCT: 6 %
NEUTROS PCT: 70 %
Neutro Abs: 6.9 10*3/uL (ref 1.7–7.7)
Platelets: 319 10*3/uL (ref 150–400)
RBC: 4.31 MIL/uL (ref 3.87–5.11)
RDW: 14.7 % (ref 11.5–15.5)
WBC: 9.8 10*3/uL (ref 4.0–10.5)

## 2014-12-10 LAB — URINALYSIS, ROUTINE W REFLEX MICROSCOPIC
Bilirubin Urine: NEGATIVE
Glucose, UA: NEGATIVE mg/dL
HGB URINE DIPSTICK: NEGATIVE
Ketones, ur: NEGATIVE mg/dL
LEUKOCYTES UA: NEGATIVE
NITRITE: NEGATIVE
PROTEIN: NEGATIVE mg/dL
SPECIFIC GRAVITY, URINE: 1.024 (ref 1.005–1.030)
pH: 5.5 (ref 5.0–8.0)

## 2014-12-10 LAB — COMPREHENSIVE METABOLIC PANEL
ALK PHOS: 89 U/L (ref 38–126)
ALT: 16 U/L (ref 14–54)
ANION GAP: 10 (ref 5–15)
AST: 19 U/L (ref 15–41)
Albumin: 4.3 g/dL (ref 3.5–5.0)
BILIRUBIN TOTAL: 0.5 mg/dL (ref 0.3–1.2)
BUN: 19 mg/dL (ref 6–20)
CALCIUM: 9.4 mg/dL (ref 8.9–10.3)
CO2: 28 mmol/L (ref 22–32)
Chloride: 103 mmol/L (ref 101–111)
Creatinine, Ser: 0.7 mg/dL (ref 0.44–1.00)
GFR calc non Af Amer: >60 mL/min (ref >60)
Glucose, Bld: 132 mg/dL — ABNORMAL HIGH (ref 65–99)
Potassium: 3.7 mmol/L (ref 3.5–5.1)
SODIUM: 141 mmol/L (ref 135–145)
TOTAL PROTEIN: 7.8 g/dL (ref 6.5–8.1)

## 2014-12-10 LAB — ABO/RH: ABO/RH(D): A POS

## 2014-12-17 MED ORDER — DEXTROSE 5 % IV SOLN
3.0000 g | INTRAVENOUS | Status: AC
  Administered 2014-12-18: 3 g via INTRAVENOUS
  Filled 2014-12-17: qty 3000

## 2014-12-18 ENCOUNTER — Encounter (HOSPITAL_COMMUNITY): Payer: Self-pay | Admitting: *Deleted

## 2014-12-18 ENCOUNTER — Ambulatory Visit (HOSPITAL_COMMUNITY)
Admission: RE | Admit: 2014-12-18 | Discharge: 2014-12-19 | Disposition: A | Discharge status: Home / Self Care | Payer: BLUE CROSS/BLUE SHIELD | Source: Ambulatory Visit | Attending: Gynecologic Oncology | Admitting: Gynecologic Oncology

## 2014-12-18 ENCOUNTER — Ambulatory Visit (HOSPITAL_COMMUNITY): Payer: BLUE CROSS/BLUE SHIELD | Admitting: Registered Nurse

## 2014-12-18 ENCOUNTER — Encounter (HOSPITAL_COMMUNITY)
Admission: RE | Disposition: A | Discharge status: Home / Self Care | Payer: Self-pay | Source: Ambulatory Visit | Attending: Gynecologic Oncology

## 2014-12-18 DIAGNOSIS — C541 Malignant neoplasm of endometrium: Secondary | ICD-10-CM | POA: Insufficient documentation

## 2014-12-18 DIAGNOSIS — N840 Polyp of corpus uteri: Secondary | ICD-10-CM | POA: Diagnosis not present

## 2014-12-18 DIAGNOSIS — N838 Other noninflammatory disorders of ovary, fallopian tube and broad ligament: Secondary | ICD-10-CM | POA: Diagnosis not present

## 2014-12-18 DIAGNOSIS — D270 Benign neoplasm of right ovary: Secondary | ICD-10-CM | POA: Diagnosis not present

## 2014-12-18 DIAGNOSIS — N950 Postmenopausal bleeding: Secondary | ICD-10-CM | POA: Diagnosis not present

## 2014-12-18 DIAGNOSIS — R1900 Intra-abdominal and pelvic swelling, mass and lump, unspecified site: Secondary | ICD-10-CM | POA: Diagnosis present

## 2014-12-18 DIAGNOSIS — M1990 Unspecified osteoarthritis, unspecified site: Secondary | ICD-10-CM | POA: Diagnosis not present

## 2014-12-18 DIAGNOSIS — Z6841 Body Mass Index (BMI) 40.0 and over, adult: Secondary | ICD-10-CM | POA: Diagnosis not present

## 2014-12-18 DIAGNOSIS — G4733 Obstructive sleep apnea (adult) (pediatric): Secondary | ICD-10-CM | POA: Diagnosis not present

## 2014-12-18 DIAGNOSIS — E119 Type 2 diabetes mellitus without complications: Secondary | ICD-10-CM | POA: Diagnosis not present

## 2014-12-18 DIAGNOSIS — Z79899 Other long term (current) drug therapy: Secondary | ICD-10-CM | POA: Insufficient documentation

## 2014-12-18 DIAGNOSIS — E785 Hyperlipidemia, unspecified: Secondary | ICD-10-CM | POA: Diagnosis not present

## 2014-12-18 DIAGNOSIS — C561 Malignant neoplasm of right ovary: Secondary | ICD-10-CM | POA: Insufficient documentation

## 2014-12-18 DIAGNOSIS — N888 Other specified noninflammatory disorders of cervix uteri: Secondary | ICD-10-CM | POA: Insufficient documentation

## 2014-12-18 DIAGNOSIS — D0739 Carcinoma in situ of other female genital organs: Secondary | ICD-10-CM

## 2014-12-18 DIAGNOSIS — E039 Hypothyroidism, unspecified: Secondary | ICD-10-CM | POA: Diagnosis not present

## 2014-12-18 DIAGNOSIS — I10 Essential (primary) hypertension: Secondary | ICD-10-CM | POA: Diagnosis not present

## 2014-12-18 DIAGNOSIS — N839 Noninflammatory disorder of ovary, fallopian tube and broad ligament, unspecified: Secondary | ICD-10-CM | POA: Diagnosis present

## 2014-12-18 DIAGNOSIS — Z87891 Personal history of nicotine dependence: Secondary | ICD-10-CM | POA: Diagnosis not present

## 2014-12-18 DIAGNOSIS — R971 Elevated cancer antigen 125 [CA 125]: Secondary | ICD-10-CM | POA: Diagnosis not present

## 2014-12-18 DIAGNOSIS — Z7984 Long term (current) use of oral hypoglycemic drugs: Secondary | ICD-10-CM | POA: Diagnosis not present

## 2014-12-18 DIAGNOSIS — G709 Myoneural disorder, unspecified: Secondary | ICD-10-CM | POA: Diagnosis not present

## 2014-12-18 HISTORY — PX: ROBOTIC ASSISTED TOTAL HYSTERECTOMY WITH BILATERAL SALPINGO OOPHERECTOMY: SHX6086

## 2014-12-18 LAB — TYPE AND SCREEN
ABO/RH(D): A POS
Antibody Screen: NEGATIVE

## 2014-12-18 LAB — GLUCOSE, CAPILLARY
GLUCOSE-CAPILLARY: 107 mg/dL — AB (ref 65–99)
GLUCOSE-CAPILLARY: 182 mg/dL — AB (ref 65–99)
GLUCOSE-CAPILLARY: 143 mg/dL — AB (ref 65–99)
Glucose-Capillary: 158 mg/dL — ABNORMAL HIGH (ref 65–99)

## 2014-12-18 SURGERY — HYSTERECTOMY, TOTAL, ROBOT-ASSISTED, LAPAROSCOPIC, WITH BILATERAL SALPINGO-OOPHORECTOMY
Anesthesia: General | Laterality: Bilateral

## 2014-12-18 MED ORDER — ONDANSETRON HCL 4 MG/2ML IJ SOLN
4.0000 mg | Freq: Four times a day (QID) | INTRAMUSCULAR | Status: DC | PRN
  Administered 2014-12-18: 4 mg via INTRAVENOUS
  Filled 2014-12-18: qty 2

## 2014-12-18 MED ORDER — MIDAZOLAM HCL 2 MG/2ML IJ SOLN
INTRAMUSCULAR | Status: AC
  Filled 2014-12-18: qty 2

## 2014-12-18 MED ORDER — LACTATED RINGERS IV SOLN
INTRAVENOUS | Status: DC | PRN
  Administered 2014-12-18: 11:00:00 via INTRAVENOUS

## 2014-12-18 MED ORDER — ONDANSETRON HCL 4 MG/2ML IJ SOLN
INTRAMUSCULAR | Status: DC | PRN
  Administered 2014-12-18: 4 mg via INTRAVENOUS

## 2014-12-18 MED ORDER — ROCURONIUM BROMIDE 100 MG/10ML IV SOLN
INTRAVENOUS | Status: AC
  Filled 2014-12-18: qty 1

## 2014-12-18 MED ORDER — ONDANSETRON HCL 4 MG/2ML IJ SOLN
INTRAMUSCULAR | Status: AC
  Filled 2014-12-18: qty 2

## 2014-12-18 MED ORDER — LEVOTHYROXINE SODIUM 112 MCG PO TABS
112.0000 ug | ORAL_TABLET | Freq: Every day | ORAL | Status: DC
  Administered 2014-12-19: 112 ug via ORAL
  Filled 2014-12-18 (×2): qty 1

## 2014-12-18 MED ORDER — ONDANSETRON HCL 4 MG PO TABS: 4.0000 mg | ORAL_TABLET | Freq: Four times a day (QID) | ORAL | Status: DC | PRN

## 2014-12-18 MED ORDER — AMLODIPINE BESYLATE 5 MG PO TABS
5.0000 mg | ORAL_TABLET | Freq: Every day | ORAL | Status: DC
  Administered 2014-12-19: 5 mg via ORAL
  Filled 2014-12-18: qty 1

## 2014-12-18 MED ORDER — LACTATED RINGERS IR SOLN
Status: DC | PRN
  Administered 2014-12-18: 1000 mL

## 2014-12-18 MED ORDER — SUCCINYLCHOLINE CHLORIDE 20 MG/ML IJ SOLN
INTRAMUSCULAR | Status: DC | PRN
  Administered 2014-12-18: 100 mg via INTRAVENOUS

## 2014-12-18 MED ORDER — SUGAMMADEX SODIUM 500 MG/5ML IV SOLN
INTRAVENOUS | Status: AC
  Filled 2014-12-18: qty 5

## 2014-12-18 MED ORDER — OXYCODONE HCL 5 MG/5ML PO SOLN
5.0000 mg | Freq: Once | ORAL | Status: DC | PRN
  Filled 2014-12-18: qty 5

## 2014-12-18 MED ORDER — LIDOCAINE HCL (CARDIAC) 20 MG/ML IV SOLN
INTRAVENOUS | Status: DC | PRN
  Administered 2014-12-18: 100 mg via INTRAVENOUS

## 2014-12-18 MED ORDER — 0.9 % SODIUM CHLORIDE (POUR BTL) OPTIME
TOPICAL | Status: DC | PRN
  Administered 2014-12-18: 1000 mL

## 2014-12-18 MED ORDER — LIDOCAINE HCL (CARDIAC) 20 MG/ML IV SOLN
INTRAVENOUS | Status: AC
  Filled 2014-12-18: qty 5

## 2014-12-18 MED ORDER — PROPOFOL 10 MG/ML IV BOLUS
INTRAVENOUS | Status: AC
  Filled 2014-12-18: qty 20

## 2014-12-18 MED ORDER — FENTANYL CITRATE (PF) 250 MCG/5ML IJ SOLN
INTRAMUSCULAR | Status: AC
  Filled 2014-12-18: qty 5

## 2014-12-18 MED ORDER — TRAMADOL HCL 50 MG PO TABS
100.0000 mg | ORAL_TABLET | Freq: Four times a day (QID) | ORAL | Status: DC
  Administered 2014-12-18 – 2014-12-19 (×4): 100 mg via ORAL
  Filled 2014-12-18 (×4): qty 2

## 2014-12-18 MED ORDER — HYDROMORPHONE HCL 1 MG/ML IJ SOLN
0.2500 mg | INTRAMUSCULAR | Status: DC | PRN
  Administered 2014-12-18 (×2): 0.5 mg via INTRAVENOUS

## 2014-12-18 MED ORDER — MIDAZOLAM HCL 5 MG/5ML IJ SOLN
INTRAMUSCULAR | Status: DC | PRN
  Administered 2014-12-18: 2 mg via INTRAVENOUS

## 2014-12-18 MED ORDER — HYDROMORPHONE HCL 1 MG/ML IJ SOLN
INTRAMUSCULAR | Status: AC
  Filled 2014-12-18: qty 1

## 2014-12-18 MED ORDER — HYDROMORPHONE HCL 2 MG/ML IJ SOLN
INTRAMUSCULAR | Status: AC
  Filled 2014-12-18: qty 1

## 2014-12-18 MED ORDER — ONDANSETRON HCL 4 MG/2ML IJ SOLN: 4.0000 mg | Freq: Four times a day (QID) | INTRAMUSCULAR | Status: DC | PRN

## 2014-12-18 MED ORDER — INSULIN ASPART 100 UNIT/ML ~~LOC~~ SOLN
0.0000 [IU] | Freq: Three times a day (TID) | SUBCUTANEOUS | Status: DC
  Administered 2014-12-18 – 2014-12-19 (×2): 3 [IU] via SUBCUTANEOUS

## 2014-12-18 MED ORDER — HYDROMORPHONE HCL 1 MG/ML IJ SOLN
INTRAMUSCULAR | Status: DC | PRN
  Administered 2014-12-18 (×4): 0.5 mg via INTRAVENOUS

## 2014-12-18 MED ORDER — HYDROMORPHONE HCL 1 MG/ML IJ SOLN: 0.2000 mg | INTRAMUSCULAR | Status: DC | PRN

## 2014-12-18 MED ORDER — PROPOFOL 10 MG/ML IV BOLUS
INTRAVENOUS | Status: DC | PRN
  Administered 2014-12-18: 210 mg via INTRAVENOUS

## 2014-12-18 MED ORDER — GABAPENTIN 300 MG PO CAPS
600.0000 mg | ORAL_CAPSULE | Freq: Every day | ORAL | Status: AC
  Administered 2014-12-18: 600 mg via ORAL
  Filled 2014-12-18: qty 2

## 2014-12-18 MED ORDER — ENOXAPARIN SODIUM 40 MG/0.4ML ~~LOC~~ SOLN
40.0000 mg | SUBCUTANEOUS | Status: DC
  Administered 2014-12-19: 40 mg via SUBCUTANEOUS
  Filled 2014-12-18 (×2): qty 0.4

## 2014-12-18 MED ORDER — ACETAMINOPHEN 500 MG PO TABS
1000.0000 mg | ORAL_TABLET | Freq: Four times a day (QID) | ORAL | Status: DC
  Administered 2014-12-18 – 2014-12-19 (×4): 1000 mg via ORAL
  Filled 2014-12-18 (×7): qty 2

## 2014-12-18 MED ORDER — OXYCODONE HCL 5 MG PO TABS: 5.0000 mg | ORAL_TABLET | ORAL | Status: DC | PRN

## 2014-12-18 MED ORDER — OXYCODONE HCL 5 MG PO TABS: 5.0000 mg | ORAL_TABLET | Freq: Once | ORAL | Status: DC | PRN

## 2014-12-18 MED ORDER — SUGAMMADEX SODIUM 500 MG/5ML IV SOLN
INTRAVENOUS | Status: DC | PRN
  Administered 2014-12-18: 250 mg via INTRAVENOUS

## 2014-12-18 MED ORDER — LISINOPRIL 10 MG PO TABS
10.0000 mg | ORAL_TABLET | Freq: Every day | ORAL | Status: DC
  Administered 2014-12-18 – 2014-12-19 (×2): 10 mg via ORAL
  Filled 2014-12-18 (×2): qty 1

## 2014-12-18 MED ORDER — ROCURONIUM BROMIDE 100 MG/10ML IV SOLN
INTRAVENOUS | Status: DC | PRN
  Administered 2014-12-18: 40 mg via INTRAVENOUS
  Administered 2014-12-18: 10 mg via INTRAVENOUS

## 2014-12-18 MED ORDER — IBUPROFEN 800 MG PO TABS: 800.0000 mg | ORAL_TABLET | Freq: Three times a day (TID) | ORAL | Status: DC | PRN

## 2014-12-18 MED ORDER — KCL IN DEXTROSE-NACL 20-5-0.45 MEQ/L-%-% IV SOLN
INTRAVENOUS | Status: DC
  Administered 2014-12-18: 17:00:00 via INTRAVENOUS
  Filled 2014-12-18 (×2): qty 1000

## 2014-12-18 MED ORDER — FENTANYL CITRATE (PF) 100 MCG/2ML IJ SOLN
INTRAMUSCULAR | Status: DC | PRN
  Administered 2014-12-18 (×5): 50 ug via INTRAVENOUS

## 2014-12-18 SURGICAL SUPPLY — 46 items
CATH FOLEY 2WAY  3CC 10FR (CATHETERS) ×1
CATH FOLEY 2WAY 3CC 10FR (CATHETERS) ×1 IMPLANT
CHLORAPREP W/TINT 26ML (MISCELLANEOUS) ×4 IMPLANT
COVER SURGICAL LIGHT HANDLE (MISCELLANEOUS) IMPLANT
COVER TIP SHEARS 8 DVNC (MISCELLANEOUS) ×1 IMPLANT
COVER TIP SHEARS 8MM DA VINCI (MISCELLANEOUS) ×1
DRAPE ARM DVNC X/XI (DISPOSABLE) ×5 IMPLANT
DRAPE COLUMN DVNC XI (DISPOSABLE) ×1 IMPLANT
DRAPE DA VINCI XI ARM (DISPOSABLE) ×5
DRAPE DA VINCI XI COLUMN (DISPOSABLE) ×1
DRAPE SHEET LG 3/4 BI-LAMINATE (DRAPES) ×2 IMPLANT
DRAPE SURG IRRIG POUCH 19X23 (DRAPES) ×2 IMPLANT
ELECT REM PT RETURN 9FT ADLT (ELECTROSURGICAL) ×2
ELECTRODE REM PT RTRN 9FT ADLT (ELECTROSURGICAL) ×1 IMPLANT
GLOVE BIO SURGEON STRL SZ 6 (GLOVE) ×8 IMPLANT
GLOVE BIO SURGEON STRL SZ 6.5 (GLOVE) ×4 IMPLANT
GOWN STRL REUS W/ TWL LRG LVL3 (GOWN DISPOSABLE) ×2 IMPLANT
GOWN STRL REUS W/TWL LRG LVL3 (GOWN DISPOSABLE) ×2
HOLDER FOLEY CATH W/STRAP (MISCELLANEOUS) ×2 IMPLANT
KIT BASIN OR (CUSTOM PROCEDURE TRAY) ×2 IMPLANT
LIQUID BAND (GAUZE/BANDAGES/DRESSINGS) ×2 IMPLANT
MANIPULATOR UTERINE 4.5 ZUMI (MISCELLANEOUS) ×2 IMPLANT
OBTURATOR XI 8MM BLADELESS (TROCAR) ×2 IMPLANT
OCCLUDER COLPOPNEUMO (BALLOONS) ×2 IMPLANT
PEN SKIN MARKING BROAD (MISCELLANEOUS) ×2 IMPLANT
POUCH ENDO CATCH II 15MM (MISCELLANEOUS) ×2 IMPLANT
POUCH SPECIMEN RETRIEVAL 10MM (ENDOMECHANICALS) ×2 IMPLANT
SEAL CANN UNIV 5-8 DVNC XI (MISCELLANEOUS) ×4 IMPLANT
SEAL XI 5MM-8MM UNIVERSAL (MISCELLANEOUS) ×4
SET TUBE IRRIG SUCTION NO TIP (IRRIGATION / IRRIGATOR) ×2 IMPLANT
SHEET LAVH (DRAPES) ×2 IMPLANT
SOLUTION ELECTROLUBE (MISCELLANEOUS) ×2 IMPLANT
STAPLER VISISTAT 35W (STAPLE) ×2 IMPLANT
SUT VIC AB 0 CT1 27 (SUTURE) ×1
SUT VIC AB 0 CT1 27XBRD ANTBC (SUTURE) ×1 IMPLANT
SUT VIC AB 4-0 PS2 27 (SUTURE) ×4 IMPLANT
SYR 50ML LL SCALE MARK (SYRINGE) ×2 IMPLANT
TOWEL OR 17X26 10 PK STRL BLUE (TOWEL DISPOSABLE) ×4 IMPLANT
TOWEL OR NON WOVEN STRL DISP B (DISPOSABLE) ×2 IMPLANT
TRAP SPECIMEN MUCOUS 40CC (MISCELLANEOUS) IMPLANT
TRAY FOLEY W/METER SILVER 14FR (SET/KITS/TRAYS/PACK) IMPLANT
TRAY FOLEY W/METER SILVER 16FR (SET/KITS/TRAYS/PACK) IMPLANT
TRAY LAPAROSCOPIC (CUSTOM PROCEDURE TRAY) ×2 IMPLANT
TROCAR BLADELESS OPT 5 100 (ENDOMECHANICALS) ×2 IMPLANT
TROCAR XCEL 12X100 BLDLESS (ENDOMECHANICALS) ×2 IMPLANT
WATER STERILE IRR 1500ML POUR (IV SOLUTION) IMPLANT

## 2014-12-18 NOTE — H&P (View-Only) (Signed)
Consult Note: Gyn-Onc  Consult was requested by Dr. Laurann Montana for the evaluation of Hannah Barrett 64 y.o. female with a 9cm complex ovarian mass  CC:  Chief Complaint  Patient presents with  . Ovarian Mass, PMB    New patient    Assessment/Plan:  Ms. Athalia Setterlund  is a 64 y.o.  year old with a complex cystic and solid ovarian mass (right) and elevated CA 125 and postmenopausal bleeding.  I discussed with the patient that I am uncertain as to whether or not this mass is malignant. There are reassuring features on imaging (the smooth regular nature of the mass, no signs of metastatic disease) however, the elevation in CA 125 is concerning for malignancy (though not diagnostic of this).   We were unable to perform uterine sampling in the office today.  Therefore, we will plan on a robotic assisted total hysterectomy, BSO and frozen section. If malignancy is identified in the ovary or endometrium, we will perform the necessary staging procedures of omentectomy/lymphadenectomy.  I discussed that she is at increased risk for perioperative complication due to her weight (morbid obesity, BMI40 kg/m2), and her diabetes and OSA. I discussed that tight perioperative glucose control is important to minimize perioperative complications. Operative complications that were discussed include  bleeding, infection, damage to internal organs (such as bladder,ureters, bowels), blood clot, reoperation and rehospitalization.   HPI: Hannah Barrett is a very pleasant 64 year old G1P1 who is seen in consultation at the request of Derrek Monaco (NP) for a complex right ovarian cyst. The patient reports having some bloody and mucoid discharge for approximately 1 year. She ignored it after her father died in 04-01-14, but allerted her physicians to this in October, 2016. At that time they performed an [place] of thepelvis on 11/24/14 which showed an 8.7cm uterus with 7m endometrial stripe and a 10cm right ovarian cyst  with thin septations. The left ovary was normal.  The CT of the abdomen and pelvis on 11/27/14 featured a 9.2cm complex right ovarian cyst with mural nodularity. There was no ascites or peritoneal nodularity or adenopathy.  CA 125 on 11/25/14 was elevated at 238.4 U/mL.   She is otherwise asymptomatic from this cyst. She denies abdominal pain or bloating. Her only symptom is postmenopausal bleeding/spotting.  She has a medical history positive for morbid obesity, diabetes mellitus (most recent HbA1c was 6.5), she has HTN, OSA and hypothyroidism. She has had no prior surgeries.  Current Meds:  Outpatient Encounter Prescriptions as of 12/01/2014  Medication Sig  . amLODipine (NORVASC) 5 MG tablet Take 5 mg by mouth daily.  .Marland Kitchenlevothyroxine (SYNTHROID, LEVOTHROID) 112 MCG tablet Take 112 mcg by mouth every morning.  .Marland Kitchenlisinopril (PRINIVIL,ZESTRIL) 10 MG tablet Take 10 mg by mouth daily.  . metformin (FORTAMET) 500 MG (OSM) 24 hr tablet Take by mouth daily with breakfast.  . Multiple Vitamin (MULTIVITAMIN) tablet Take 1 tablet by mouth daily.  . [DISCONTINUED] amoxicillin (AMOXIL) 500 MG capsule 500 mg 2 (two) times daily.    No facility-administered encounter medications on file as of 12/01/2014.    Allergy: No Known Allergies  Social Hx:   Social History   Social History  . Marital Status: Married    Spouse Name: N/A  . Number of Children: N/A  . Years of Education: N/A   Occupational History  . Not on file.   Social History Main Topics  . Smoking status: Former Smoker    Types: Cigarettes  . Smokeless tobacco:  Never Used  . Alcohol Use: No  . Drug Use: No  . Sexual Activity: Not Currently    Birth Control/ Protection: Post-menopausal   Other Topics Concern  . Not on file   Social History Narrative    Past Surgical Hx: History reviewed. No pertinent past surgical history.  Past Medical Hx:  Past Medical History  Diagnosis Date  . Diabetes mellitus without  complication ([place])   . Hyperlipidemia   . Hypertension   . Thyroid disease   . Overactive bladder   . PMB (postmenopausal bleeding) 11/18/2014    Past Gynecological History:  SVD x 1  No LMP recorded. Patient is postmenopausal.  Family Hx:  Family History  Problem Relation Age of Onset  . Congestive Heart Failure Mother   . Arthritis Father   . Other Sister     brain injury after fall  . Cancer Sister     multiple mylemoma  . Obesity Daughter   . Diabetes Maternal Grandmother   . Parkinson's disease Paternal Grandfather     Review of Systems:  Constitutional  Feels well,    ENT Normal appearing ears and nares bilaterally Skin/Breast  No rash, sores, jaundice, itching, dryness Cardiovascular  No chest pain, shortness of breath, or edema  Pulmonary  No cough or wheeze.  Gastro Intestinal  No nausea, vomitting, or diarrhoea. No bright red blood per rectum, no abdominal pain, change in bowel movement, or constipation.  Genito Urinary  No frequency, urgency, dysuria, + postmenopausal bleeding Musculo Skeletal  No myalgia, arthralgia, joint swelling or pain  Neurologic  No weakness, numbness, change in gait,  Psychology  No depression, anxiety, insomnia.   Vitals:  Blood pressure 136/62, pulse 83, temperature 98.3 F (36.8 C), temperature source Oral, resp. rate 19, height '5\' 9"'$  (1.753 m), weight 270 lb 11.2 oz (122.789 kg), SpO2 98 %.  Physical Exam: WD in NAD Neck  Supple NROM, without any enlargements.  Lymph Node Survey No cervical supraclavicular or inguinal adenopathy Cardiovascular  Pulse normal rate, regularity and rhythm. S1 and S2 normal.  Lungs  Clear to auscultation bilateraly, without wheezes/crackles/rhonchi. Good air movement.  Skin  No rash/lesions/breakdown  Psychiatry  Alert and oriented to person, place, and time  Abdomen  Normoactive bowel sounds, abdomen soft, non-tender and obese without evidence of hernia.  Back No CVA  tenderness Genito Urinary  Vulva/vagina: Normal external female genitalia.   No lesions. No discharge or bleeding.  Bladder/urethra:  No lesions or masses, well supported bladder  Vagina: normal  Cervix:Unable to visualize secondary to long vaginal and obesity.  Uterus: Small, mobile, no parametrial involvement or nodularity.  Adnexa: unable to appreciate mass due to body habitus.. Rectal  Good tone, no masses no cul de sac nodularity.  Extremities  No bilateral cyanosis, clubbing or edema.   Donaciano Eva, MD  12/01/2014, 4:22 PM

## 2014-12-18 NOTE — Op Note (Signed)
OPERATIVE NOTE 12/18/14  Surgeon: Donaciano Eva   Assistants: Lahoma Crocker (an MD assistant was necessary for tissue manipulation, management of robotic instrumentation, retraction and positioning due to the complexity of the case and hospital policies).   Anesthesia: General endotracheal anesthesia  ASA Class: 3   Pre-operative Diagnosis: pelvic mass, elevated CA 125, postmenopausal bleeding, morbid obesity (BMI 44kg/m2)  Post-operative Diagnosis: same and right ovarian LMP  Operation: Robotic-assisted laparoscopic hysterectomy with bilateral salpingoophorectomy   Surgeon: Donaciano Eva  Assistant Surgeon: Lahoma Crocker MD  Anesthesia: GET  Urine Output: 200  Operative Findings:  : urethral stenosis/stricture, 10 week size uterus, 10cm right ovarian cyst (mostly cystic with excrescences) LMP on frozen, excresences on left tube and ovary, 62m implants on anterior and posterior cul de sacs (resected with hysterectomy specimen. No gross upper abdominal disease. Endometrial polyp on frozen section.  Estimated Blood Loss:  less than 50 mL      Total IV Fluids: 1000 ml         Specimens: uterus, cervix, bilateral tubes and ovaries washings         Complications:  None; patient tolerated the procedure well.         Disposition: PACU - hemodynamically stable.  Procedure Details  The patient was seen in the Holding Room. The risks, benefits, complications, treatment options, and expected outcomes were discussed with the patient.  The patient concurred with the proposed plan, giving informed consent.  The site of surgery properly noted/marked. The patient was identified as Hannah Barrett the procedure verified as a Robotic-assisted hysterectomy with bilateral salpingo oophorectomy. A Time Out was held and the above information confirmed.  After induction of anesthesia, the patient was draped and prepped in the usual sterile manner. Pt was placed in supine  position after anesthesia and draped and prepped in the usual sterile manner. The abdominal drape was placed after the CholoraPrep had been allowed to dry for 3 minutes.  Her arms were tucked to her side with all appropriate precautions.  The shoulders were stabilized with shoulder blocks.  The patient was placed in the semi-lithotomy position in [place]  The perineum was prepped with Betadine.  Foley catheter was attempted but the urethral meatus would not accept a 14 french catheter, and therefore a 10 french catheter was placed with some difficulty due to an apparent urethral stricture/narrowing. A sterile speculum was placed in the vagina.  The cervix was grasped with a single-tooth tenaculum and dilated with PKennon Roundsdilators.  The ZUMI uterine manipulator with a medium colpotomizer ring was placed without difficulty.  A pneum occluder balloon was placed over the manipulator.  A second time-out was performed.  OG tube placement was confirmed and to suction.    Procedure:  The patient was then draped in the normal manner.  Next, a 5 mm skin incision was made 1 cm below the subcostal margin in the midclavicular line.  The 5 mm Optiview port and scope was used for direct entry.  Opening pressure was under 10 mm CO2.  The abdomen was insufflated and the findings were noted as above.   At this point and all points during the procedure, the patient's intra-abdominal pressure did not exceed 15 mmHg. Next, an 8 mm skin incision was made at the umbilicus and a right and left port was placed about 10 cm lateral to the robot port on the right and left side.  A fourth arm was placed in the left lower quadrant  2 cm above and superior and medial to the anterior superior iliac spine.  All ports were placed under direct visualization.  The patient was placed in steep Trendelenburg.  Bowel was away into the upper abdomen.  The robot was docked in the normal manner.  The hysterectomy was started after the round  ligament on the right side was incised and the retroperitoneum was entered and the pararectal space was developed.  The ureter was noted to be on the medial leaf of the broad ligament.  The peritoneum above the ureter was incised and stretched and the infundibulopelvic ligament was skeletonized, cauterized and cut.  The posterior peritoneum was taken down to the level of the KOH ring with care to ensure that all millial nodules that were identified in the posterior cul de sac were included with the specimen.  The anterior peritoneum was also taken down, similarly ensuring peritoneal nodules remained with the peritoneum of the specimen.  The bladder flap was created to the level of the KOH ring.  The uterine artery on the right side was skeletonized, cauterized and cut in the normal manner.  A similar procedure was performed on the left.  The colpotomy was made and the uterus, cervix, left ovary and tube were amputated and delivered through the vagina.  The right ovary was separately amputated from the right cornua and placed in a 15cm spleen bag for separate remval from the vagina. It was ruptured through the vaginal incision while contained within the bag, with no intraperitoneal spillage. The specimen was removed from the vagina. Pedicles were inspected and excellent hemostasis was achieved.    The colpotomy at the vaginal cuff was closed with Vicryl on a CT1 needle in a running manner.  Irrigation was used and excellent hemostasis was achieved.  At this point in the procedure was completed.  Robotic instruments were removed under direct visulaization.  The robot was undocked. The 10 mm ports were closed with Vicryl on a UR-5 needle and the fascia was closed with 0 Vicryl on a UR-5 needle.  The skin was closed with 4-0 Vicryl in a subcuticular manner.  Dermabond was applied.  Sponge, lap and needle counts correct x 2.  The patient was taken to the recovery room in stable condition.  The vagina was swabbed with   minimal bleeding noted.   All instrument and needle counts were correct x  3.   The patient was transferred to the recovery room in a stable condition.   Donaciano Eva, MD

## 2014-12-18 NOTE — Interval H&P Note (Signed)
History and Physical Interval Note:  12/18/2014 11:29 AM  Hannah Barrett  has presented today for surgery, with the diagnosis of ovarian mass  The various methods of treatment have been discussed with the patient and family. After consideration of risks, benefits and other options for treatment, the patient has consented to  Procedure(s): XI ROBOTIC ASSISTED TOTAL HYSTERECTOMY WITH BILATERAL SALPINGO OOPHORECTOMY, POSSIBLE STAGING, OMENTECTOMY, LYMPHADECTOMY (Bilateral) as a surgical intervention .  The patient's history has been reviewed, patient examined, no change in status, stable for surgery.  I have reviewed the patient's chart and labs.  Questions were answered to the patient's satisfaction.     Donaciano Eva

## 2014-12-18 NOTE — Anesthesia Postprocedure Evaluation (Signed)
Anesthesia Post Note  Patient: Hannah Barrett  Procedure(s) Performed: Procedure(s) (LRB): XI ROBOTIC ASSISTED TOTAL HYSTERECTOMY WITH BILATERAL SALPINGO OOPHORECTOMY (Bilateral)  Patient location during evaluation: PACU Anesthesia Type: General Level of consciousness: awake and alert Pain management: pain level controlled Vital Signs Assessment: post-procedure vital signs reviewed and stable Respiratory status: spontaneous breathing, nonlabored ventilation, respiratory function stable and patient connected to nasal cannula oxygen Cardiovascular status: blood pressure returned to baseline and stable Postop Assessment: no signs of nausea or vomiting Anesthetic complications: no    Last Vitals:  Filed Vitals:   12/18/14 1532 12/18/14 1656  BP: 143/69 122/64  Pulse: 82   Temp: 36.5 C   Resp: 16     Last Pain:  Filed Vitals:   12/18/14 1657  PainSc: 4                  Enes Rokosz J

## 2014-12-18 NOTE — Transfer of Care (Signed)
Immediate Anesthesia Transfer of Care Note  Patient: Hannah Barrett  Procedure(s) Performed: Procedure(s): XI ROBOTIC ASSISTED TOTAL HYSTERECTOMY WITH BILATERAL SALPINGO OOPHORECTOMY (Bilateral)  Patient Location: PACU  Anesthesia Type:General  Level of Consciousness: awake, alert , oriented and patient cooperative  Airway & Oxygen Therapy: Patient Spontanous Breathing and Patient connected to face mask oxygen  Post-op Assessment: Report given to RN, Post -op Vital signs reviewed and stable and Patient moving all extremities  Post vital signs: Reviewed and stable  Last Vitals: There were no vitals filed for this visit.  Complications: No apparent anesthesia complications

## 2014-12-18 NOTE — Anesthesia Preprocedure Evaluation (Addendum)
Anesthesia Evaluation  Patient identified by MRN, date of birth, ID band Patient awake    Reviewed: Allergy & Precautions, NPO status , Patient's Chart, lab work & pertinent test results  Airway Mallampati: II   Neck ROM: full    Dental   Pulmonary sleep apnea , former smoker,    breath sounds clear to auscultation       Cardiovascular hypertension,  Rhythm:regular Rate:Normal     Neuro/Psych  Neuromuscular disease    GI/Hepatic   Endo/Other  diabetes, Type 2Hypothyroidism Morbid obesity  Renal/GU      Musculoskeletal  (+) Arthritis ,   Abdominal   Peds  Hematology   Anesthesia Other Findings   Reproductive/Obstetrics                            Anesthesia Physical Anesthesia Plan  ASA: II  Anesthesia Plan: General   Post-op Pain Management:    Induction: Intravenous  Airway Management Planned: Oral ETT  Additional Equipment:   Intra-op Plan:   Post-operative Plan: Extubation in OR  Informed Consent: I have reviewed the patients History and Physical, chart, labs and discussed the procedure including the risks, benefits and alternatives for the proposed anesthesia with the patient or authorized representative who has indicated his/her understanding and acceptance.     Plan Discussed with: CRNA, Anesthesiologist and Surgeon  Anesthesia Plan Comments:         Anesthesia Quick Evaluation

## 2014-12-18 NOTE — Anesthesia Procedure Notes (Signed)
Procedure Name: Intubation Date/Time: 12/18/2014 11:50 AM Performed by: Carleene Cooper A Pre-anesthesia Checklist: Patient identified, Timeout performed, Emergency Drugs available, Suction available and Patient being monitored Patient Re-evaluated:Patient Re-evaluated prior to inductionOxygen Delivery Method: Circle system utilized Preoxygenation: Pre-oxygenation with 100% oxygen Intubation Type: IV induction Ventilation: Mask ventilation without difficulty Laryngoscope Size: Mac and 4 Grade View: Grade I Tube type: Oral Tube size: 7.5 mm Number of attempts: 1 Airway Equipment and Method: Stylet Placement Confirmation: ETT inserted through vocal cords under direct vision,  breath sounds checked- equal and bilateral and positive ETCO2 Secured at: 21 cm Tube secured with: Tape Dental Injury: Teeth and Oropharynx as per pre-operative assessment

## 2014-12-19 DIAGNOSIS — C541 Malignant neoplasm of endometrium: Secondary | ICD-10-CM | POA: Diagnosis not present

## 2014-12-19 LAB — BASIC METABOLIC PANEL
Anion gap: 4 — ABNORMAL LOW (ref 5–15)
BUN: 12 mg/dL (ref 6–20)
CALCIUM: 8 mg/dL — AB (ref 8.9–10.3)
CO2: 29 mmol/L (ref 22–32)
CREATININE: 0.68 mg/dL (ref 0.44–1.00)
Chloride: 103 mmol/L (ref 101–111)
GFR calc non Af Amer: >60 mL/min (ref >60)
Glucose, Bld: 123 mg/dL — ABNORMAL HIGH (ref 65–99)
Potassium: 3.9 mmol/L (ref 3.5–5.1)
SODIUM: 136 mmol/L (ref 135–145)

## 2014-12-19 LAB — CBC
HCT: 34.8 % — ABNORMAL LOW (ref 36.0–46.0)
Hemoglobin: 11.1 g/dL — ABNORMAL LOW (ref 12.0–15.0)
MCH: 29.8 pg (ref 26.0–34.0)
MCHC: 31.9 g/dL (ref 30.0–36.0)
MCV: 93.5 fL (ref 78.0–100.0)
Platelets: 260 10*3/uL (ref 150–400)
RBC: 3.72 MIL/uL — ABNORMAL LOW (ref 3.87–5.11)
RDW: 14.8 % (ref 11.5–15.5)
WBC: 12.6 10*3/uL — ABNORMAL HIGH (ref 4.0–10.5)

## 2014-12-19 MED ORDER — TRAMADOL HCL 50 MG PO TABS: 50.0000 mg | ORAL_TABLET | Freq: Four times a day (QID) | ORAL | Status: DC | PRN

## 2014-12-19 NOTE — Discharge Summary (Signed)
Physician Discharge Summary  Patient ID: Hannah Barrett MRN: [id number] DOB/AGE: 1950/03/18 64 y.o.  Admit date: 12/18/2014 Discharge date: 12/19/2014  Admission Diagnoses: Ovarian mass, right  Discharge Diagnoses:  Principal Problem:   Ovarian mass, right Active Problems:   PMB (postmenopausal bleeding)   Morbid obesity with BMI of 40.0-44.9, adult ([place])   Pelvic mass in female   Discharged Condition:  The patient is in good condition and stable for discharge.    Hospital Course: On 12/18/2014, the patient underwent the following: Procedure(s): XI ROBOTIC ASSISTED TOTAL HYSTERECTOMY WITH BILATERAL SALPINGO OOPHORECTOMY.   The postoperative course was uneventful.  She was discharged to home on postoperative day 1 tolerating a regular diet, minimal pain, voiding without difficulty.  Consults: None  Significant Diagnostic Studies: None  Treatments: surgery: see above  Discharge Exam: Blood pressure 125/57, pulse 72, temperature 99.1 F (37.3 C), temperature source Oral, resp. rate 14, height '5\' 9"'$  (1.753 m), weight 271 lb (122.925 kg), SpO2 94 %. General appearance: alert, cooperative and no distress Resp: clear to auscultation bilaterally Cardio: regular rate and rhythm, S1, S2 normal, no murmur, click, rub or gallop GI: soft, non-tender; bowel sounds normal; no masses,  no organomegaly Extremities: extremities normal, atraumatic, no cyanosis or edema Incision/Wound: Lap sites to the abdomen with dermabond without erythema or drainage  Disposition:Home      Discharge Instructions    Call MD for:  difficulty breathing, headache or visual disturbances    Complete by:  As directed      Call MD for:  extreme fatigue    Complete by:  As directed      Call MD for:  hives    Complete by:  As directed      Call MD for:  persistant dizziness or light-headedness    Complete by:  As directed      Call MD for:  persistant nausea and vomiting    Complete by:  As directed      Call MD for:  redness, tenderness, or signs of infection (pain, swelling, redness, odor or green/yellow discharge around incision site)    Complete by:  As directed      Call MD for:  severe uncontrolled pain    Complete by:  As directed      Call MD for:  temperature >100.4    Complete by:  As directed      Diet - low sodium heart healthy    Complete by:  As directed      Driving Restrictions    Complete by:  As directed   No driving for 1 week.  Do not take narcotics and drive.     Increase activity slowly    Complete by:  As directed      Lifting restrictions    Complete by:  As directed   No lifting greater than 10 lbs.     Sexual Activity Restrictions    Complete by:  As directed   No sexual activity, nothing in the vagina, for 8 weeks.            Medication List    TAKE these medications        acetaminophen 500 MG tablet  Commonly known as:  TYLENOL  Take 1,000 mg by mouth every 6 (six) hours as needed for mild pain or headache.     amLODipine 5 MG tablet  Commonly known as:  NORVASC  Take 5 mg by mouth daily.     levothyroxine 112  MCG tablet  Commonly known as:  SYNTHROID, LEVOTHROID  Take 112 mcg by mouth every morning.     lisinopril 10 MG tablet  Commonly known as:  PRINIVIL,ZESTRIL  Take 10 mg by mouth daily.     metformin 500 MG (OSM) 24 hr tablet  Commonly known as:  FORTAMET  Take by mouth daily with breakfast.     multivitamin tablet  Take 1 tablet by mouth daily.     traMADol 50 MG tablet  Commonly known as:  ULTRAM  Take 1 tablet (50 mg total) by mouth every 6 (six) hours as needed.       Follow-up Information    Follow up with Donaciano Eva, MD On 01/08/2015.   Specialty:  Obstetrics and Gynecology   Why:  at 10:30am at the [facility] information:   [place] [address] [phone]       Greater than thirty minutes were spend for face to face discharge instructions and discharge orders/summary in  EPIC.   Signed: Megahn Killings DEAL 12/19/2014, 10:07 AM

## 2014-12-19 NOTE — Progress Notes (Signed)
Pt's vitals wnl, pain controlled and diet is tolerated. Discharged to home with prescriptions.

## 2014-12-19 NOTE — Discharge Instructions (Signed)
12/19/2014  Return to work: 4-6 weeks if applicable  Activity: 1. Be up and out of the bed during the day.  Take a nap if needed.  You may walk up steps but be careful and use the hand rail.  Stair climbing will tire you more than you think, you may need to stop part way and rest.   2. No lifting or straining for 6 weeks.  3. No driving for 1 week(s).  Do not drive if you are taking narcotic pain medicine.  4. Shower daily.  Use soap and water on your incision and pat dry; don't rub.  No tub baths until cleared by your surgeon.   5. No sexual activity and nothing in the vagina for 8 weeks.  6. You may experience a small amount of clear drainage from your incisions, which is normal.  If the drainage persists or increases, please call the office.   Diet: 1. Low sodium Heart Healthy Diet is recommended.  2. It is safe to use a laxative, such as Miralax or Colace, if you have difficulty moving your bowels.   Wound Care: 1. Keep clean and dry.  Shower daily.  Reasons to call the Doctor:  Fever - Oral temperature greater than 100.4 degrees Fahrenheit  Foul-smelling vaginal discharge  Difficulty urinating  Nausea and vomiting  Increased pain at the site of the incision that is unrelieved with pain medicine.  Difficulty breathing with or without chest pain  New calf pain especially if only on one side  Sudden, continuing increased vaginal bleeding with or without clots.   Contacts: For questions or concerns you should contact:  Dr. Everitt Amber at [phone]  Joylene John, NP at [phone]  After Hours: call [phone] and have the GYN Oncologist paged/contacted  Abdominal Hysterectomy, Care After These instructions give you information on caring for yourself after your procedure. Your doctor may also give you more specific instructions. Call your doctor if you have any problems or questions after your procedure.  HOME CARE It takes 4-6 weeks to recover from this  surgery. Follow all of your doctor's instructions.   Only take medicines as told by your doctor.  Change your bandage as told by your doctor.  Return to your doctor to have your stitches taken out.  Take showers for 2-3 weeks. Ask your doctor when it is okay to shower.  Do not douche, use tampons, or have sex (intercourse) for at least 6 weeks or as told.  Follow your doctor's advice about exercise, lifting objects, driving, and general activities.  Get plenty of rest and sleep.  Do not lift anything heavier than a gallon of milk (about 10 pounds [4.5 kilograms]) for the first month after surgery.  Get back to your normal diet as told by your doctor.  Do not drink alcohol until your doctor says it is okay.  Take a medicine to help you poop (laxative) as told by your doctor.  Eating foods high in fiber may help you poop. Eat a lot of raw fruits and vegetables, whole grains, and beans.  Drink enough fluids to keep your pee (urine) clear or pale yellow.  Have someone help you at home for 1-2 weeks after your surgery.  Keep follow-up doctor visits as told. GET HELP IF:  You have chills or fever.  You have puffiness, redness, or pain in area of the cut (incision).  You have yellowish-white fluid (pus) coming from the cut.  You have a bad smell coming from  the cut or bandage.  Your cut pulls apart.  You feel dizzy or light-headed.  You have pain or bleeding when you pee.  You keep having watery poop (diarrhea).  You keep feeling sick to your stomach (nauseous) or keep throwing up (vomiting).  You have fluid (discharge) coming from your vagina.  You have a rash.  You have a reaction to your medicine.  You need stronger pain medicine. GET HELP RIGHT AWAY IF:   You have a fever and your symptoms suddenly get worse.  You have bad belly (abdominal) pain.  You have chest pain.  You are short of breath.  You pass out (faint).  You have pain, puffiness, or  redness of your leg.  You bleed a lot from your vagina and notice clumps of tissue (clots). MAKE SURE YOU:   Understand these instructions.  Will watch your condition.  Will get help right away if you are not doing well or get worse.   This information is not intended to replace advice given to you by your health care provider. Make sure you discuss any questions you have with your health care provider.   Document Released: 10/13/2007 Document Revised: 01/08/2013 Document Reviewed: 10/26/2012 Elsevier Interactive Patient Education [employer].

## 2014-12-21 LAB — GLUCOSE, CAPILLARY
GLUCOSE-CAPILLARY: 99 mg/dL (ref 65–99)
GLUCOSE-CAPILLARY: 160 mg/dL — AB (ref 65–99)

## 2014-12-29 ENCOUNTER — Telehealth: Payer: Self-pay | Admitting: Gynecologic Oncology

## 2014-12-29 NOTE — Telephone Encounter (Signed)
Informed patient of findings of stage IC LMP of ovary and incidental finding of stage IA endometrial cancer.  No adjuvant therapy recommended. Informed patient of low probability of recurrence for both, but will require followup for 5-10 years. Donaciano Eva, MD

## 2015-01-06 ENCOUNTER — Telehealth: Payer: Self-pay | Admitting: Gynecologic Oncology

## 2015-01-06 NOTE — Telephone Encounter (Signed)
Returned call to patient.  "I am not doing well.  My stomach has been hurting below my naval and I have had some bleeding."  Stating she feels the bleeding is vaginal, bright red at times.  Bowels are moving and passing flatus.  Voiding without difficulty.  No fever or chills.  No nausea or emesis reported.  Incisions are all healed except for one at her naval and under her breast.  The pain started as "little twinges and has gotten more steady."  Use of tramadol and heating pad with little relief.  She has an appt to see Dr. Denman George on Thurs of this week.  Advised she could take another tramadol at this time since she has 50 mg tablets and Dr. Denman George will be notified of the situation.  Patient advised we would contact her with Dr. Serita Grit recommendations.

## 2015-01-06 NOTE — Telephone Encounter (Signed)
Called patient to discuss symptoms. States she has had some bleeding vaginally for 48 hours (now letting up). Increased abdominal pain this morning. Helped with Tramadol. Eating, passing flatus, no emesis, no fevers.  She will notify me if she feels worse before her visit on Thursday and we would arrange to see her sooner.  Donaciano Eva, MD

## 2015-01-08 ENCOUNTER — Ambulatory Visit: Payer: BLUE CROSS/BLUE SHIELD | Attending: Gynecologic Oncology | Admitting: Gynecologic Oncology

## 2015-01-08 ENCOUNTER — Encounter: Payer: Self-pay | Admitting: Gynecologic Oncology

## 2015-01-08 DIAGNOSIS — N898 Other specified noninflammatory disorders of vagina: Secondary | ICD-10-CM | POA: Diagnosis not present

## 2015-01-08 DIAGNOSIS — N839 Noninflammatory disorder of ovary, fallopian tube and broad ligament, unspecified: Secondary | ICD-10-CM | POA: Insufficient documentation

## 2015-01-08 DIAGNOSIS — N838 Other noninflammatory disorders of ovary, fallopian tube and broad ligament: Secondary | ICD-10-CM

## 2015-01-08 MED ORDER — METRONIDAZOLE 500 MG PO TABS: 500.0000 mg | ORAL_TABLET | Freq: Three times a day (TID) | ORAL | Status: DC

## 2015-01-08 MED ORDER — CIPROFLOXACIN HCL 250 MG PO TABS
250.0000 mg | ORAL_TABLET | Freq: Two times a day (BID) | ORAL | Status: DC
Start: 2015-01-08 — End: 2015-03-30

## 2015-01-08 NOTE — Patient Instructions (Signed)
Plan to follow up in six months or sooner if needed.  Call us for any new symptoms including abdominal pain, nausea, emesis.

## 2015-01-08 NOTE — Progress Notes (Signed)
FOLLOW UP NOTE   Assessment:    64 y.o. year old with Stage IC right serous LMP and incidentally found stage IA grade1 endometrioid endometrial cancer.   S/p robotic hysterectomy, BSO on 12/18/14. The endometrial cancer had no LVSI, no myo invasion. Nodes were not sampled. The ovarian LMP had positive washings and small implants on the uterine serosa, and a preop elevated CA 125 of 238.   She has some bleeding that is foul smelling and may have some cuff cellulitis.  Plan: 1) Pathology reports reviewed today 2) Treatment counseling - From the endometrial cancer stand point she is very low risk (<5%) for recurrence given age, grade, depth of myometrial invasion and LVSI status. Multidisciplinary tumor board recommendation is for routine surveillance with frequent pelvic exams and visits.  From the LMP standpoint she has a 5% risk for recurrence and no adjuvant therapy is recommended. We will start with visits every 6 months for 10 years, at which time she can return to annual visits.   We will check CA 125 at her visits. Discussed signs and symptoms of recurrence including vaginal bleeding or discharge, leg pain or swelling and changes in bowel or bladder habits. She was given the opportunity to ask questions, which were answered to her satisfaction, and she is agreement with the above mentioned plan of care. 3) flagyl and cipro x 1 week for empiric therapy for presumed cuff cellulits. 4)  Return to clinic in 6 months  HPI:  Hannah Barrett is a very pleasant 64 year old G1P1 who is seen in consultation at the request of Derrek Monaco (NP) for a complex right ovarian cyst. The patient reports having some bloody and mucoid discharge for approximately 1 year. She ignored it after her father died in 04-14-14, but allerted her physicians to this in October, 2016. At that time they performed an [place] of thepelvis on 11/24/14 which showed an 8.7cm uterus with 40m endometrial stripe and a 10cm right  ovarian cyst with thin septations. The left ovary was normal.  The CT of the abdomen and pelvis on 11/27/14 featured a 9.2cm complex right ovarian cyst with mural nodularity. There was no ascites or peritoneal nodularity or adenopathy.  CA 125 on 11/25/14 was elevated at 238.4 U/mL.  She then underwent a robotic hysterectomy, BSO, on 135/3/61without complications.  Her postoperative course was uncomplicated.  Her final pathologic diagnosis is a Stage IA Grade 1 endometrioid endometrial cancer with no lymphovascular space invasion, no myometrial invasion, this was incidentally found. The right ovary contained a serous low malignant potential tumor with no surface involvement. Washings were positive.  Interval Hx: She is seen today for a postoperative check and to discuss her pathology results and treatment plan.  Since discharge from the hospital, she is feeling better since we last spoke on 2 days ago.  She has improving appetite, normal bowel and bladder function, and pain controlled with minimal PO medication. She has had passage of blood from the vagina that she describes as foul smelling and suprapubic discomfort. She has no other complaints today.  Past Medical History  Diagnosis Date  . Diabetes mellitus without complication ([place]   . Hyperlipidemia   . Hypertension   . Thyroid disease   . Overactive bladder   . PMB (postmenopausal bleeding) 11/18/2014  . Arthritis    Past Surgical History  Procedure Laterality Date  . Colonoscopy    . Cystoscopy    . Robotic assisted total hysterectomy with bilateral salpingo  oopherectomy Bilateral 12/18/2014    Procedure: XI ROBOTIC ASSISTED TOTAL HYSTERECTOMY WITH BILATERAL SALPINGO OOPHORECTOMY;  Surgeon: Everitt Amber, MD;  Location: [facility];  Service: Gynecology;  Laterality: Bilateral;   Family History  Problem Relation Age of Onset  . Congestive Heart Failure Mother   . Arthritis Father   . Other Sister     brain injury after fall  . Cancer  Sister     multiple mylemoma  . Obesity Daughter   . Diabetes Maternal Grandmother   . Parkinson's disease Paternal Grandfather    Social History   Social History  . Marital Status: Married    Spouse Name: N/A  . Number of Children: N/A  . Years of Education: N/A   Occupational History  . Not on file.   Social History Main Topics  . Smoking status: Former Smoker    Types: Cigarettes    Quit date: 10/27/2004  . Smokeless tobacco: Never Used  . Alcohol Use: No  . Drug Use: No  . Sexual Activity: Not Currently    Birth Control/ Protection: Post-menopausal   Other Topics Concern  . Not on file   Social History Narrative   Current Outpatient Prescriptions on File Prior to Visit  Medication Sig Dispense Refill  . acetaminophen (TYLENOL) 500 MG tablet Take 1,000 mg by mouth every 6 (six) hours as needed for mild pain or headache.    Marland Kitchen amLODipine (NORVASC) 5 MG tablet Take 5 mg by mouth daily.    Marland Kitchen levothyroxine (SYNTHROID, LEVOTHROID) 112 MCG tablet Take 112 mcg by mouth every morning.  6  . lisinopril (PRINIVIL,ZESTRIL) 10 MG tablet Take 10 mg by mouth daily.    . Multiple Vitamin (MULTIVITAMIN) tablet Take 1 tablet by mouth daily.    . traMADol (ULTRAM) 50 MG tablet Take 1 tablet (50 mg total) by mouth every 6 (six) hours as needed. 30 tablet 0   No current facility-administered medications on file prior to visit.   No Known Allergies   Review of systems: Constitutional:  She has no weight gain or weight loss. She has no fever or chills. Eyes: No blurred vision Ears, Nose, Mouth, Throat: No dizziness, headaches or changes in hearing. No mouth sores. Cardiovascular: No chest pain, palpitations or edema. Respiratory:  No shortness of breath, wheezing or cough Gastrointestinal: She has normal bowel movements without diarrhea or constipation. She denies any nausea or vomiting. She denies blood in her stool or heart burn. Genitourinary:  She denies pelvic pain, pelvic  pressure or changes in her urinary function. She has no hematuria, dysuria, or incontinence. + vaginal bleeding and vaginal discharge Musculoskeletal: Denies muscle weakness or joint pains.  Skin:  She has no skin changes, rashes or itching Neurological:  Denies dizziness or headaches. No neuropathy, no numbness or tingling. Psychiatric:  She denies depression or anxiety. Hematologic/Lymphatic:   No easy bruising or bleeding   Physical Exam: There were no vitals taken for this visit. General: Well dressed, well nourished in no apparent distress.   HEENT:  Normocephalic and atraumatic, no lesions.  Extraocular muscles intact. Sclerae anicteric. Pupils equal, round, reactive. No mouth sores or ulcers. Thyroid is normal size, not nodular, midline. Abdomen:  Soft, nontender, nondistended.  No palpable masses.  No hepatosplenomegaly.  No ascites. Normal bowel sounds.  No hernias.  Incisions are well healed. Genitourinary: Normal EGBUS  Vaginal cuff intact.  No bleeding or discharge.  Limited exam due to long vagina and narrow upper vagina. No cul de sac  fullness. Extremities: No cyanosis, clubbing or edema.  No calf tenderness or erythema. No palpable cords. Psychiatric: Mood and affect are appropriate. Neurological: Awake, alert and oriented x 3. Sensation is intact, no neuropathy.  Musculoskeletal: No pain, normal strength and range of motion.   30 minutes of direct face to face counseling time was spent with the patient. This included discussion about prognosis, therapy recommendations and postoperative side effects and are beyond the scope of routine postoperative care. Donaciano Eva, MD

## 2015-01-14 ENCOUNTER — Telehealth: Payer: Self-pay

## 2015-01-14 NOTE — Telephone Encounter (Signed)
Follow up call placed , patient states she went to see her PCP today and it was recommended to follow Dr Terrence Dupont Rossi's recommendations to start the Cipro and Flagyl for vaginal cuff cellulitis . Patient states she initiated the Cipro and Flagyl this morning ,patient denies further questions at this time , will call with any changes .

## 2015-01-15 ENCOUNTER — Ambulatory Visit: Payer: BLUE CROSS/BLUE SHIELD | Admitting: Gynecologic Oncology

## 2015-02-05 ENCOUNTER — Ambulatory Visit: Payer: BLUE CROSS/BLUE SHIELD | Admitting: Nutrition

## 2015-02-10 ENCOUNTER — Encounter (INDEPENDENT_AMBULATORY_CARE_PROVIDER_SITE_OTHER): Payer: Self-pay | Admitting: *Deleted

## 2015-02-10 ENCOUNTER — Encounter (INDEPENDENT_AMBULATORY_CARE_PROVIDER_SITE_OTHER): Payer: Self-pay

## 2015-02-10 ENCOUNTER — Other Ambulatory Visit (INDEPENDENT_AMBULATORY_CARE_PROVIDER_SITE_OTHER): Payer: Self-pay | Admitting: *Deleted

## 2015-02-10 DIAGNOSIS — Z1211 Encounter for screening for malignant neoplasm of colon: Secondary | ICD-10-CM

## 2015-02-19 DIAGNOSIS — K219 Gastro-esophageal reflux disease without esophagitis: Secondary | ICD-10-CM | POA: Diagnosis not present

## 2015-02-19 DIAGNOSIS — E039 Hypothyroidism, unspecified: Secondary | ICD-10-CM | POA: Diagnosis not present

## 2015-02-19 DIAGNOSIS — E7800 Pure hypercholesterolemia, unspecified: Secondary | ICD-10-CM | POA: Diagnosis not present

## 2015-02-19 DIAGNOSIS — E119 Type 2 diabetes mellitus without complications: Secondary | ICD-10-CM | POA: Diagnosis not present

## 2015-02-19 DIAGNOSIS — I10 Essential (primary) hypertension: Secondary | ICD-10-CM | POA: Diagnosis not present

## 2015-02-19 LAB — HEMOGLOBIN A1C: HEMOGLOBIN A1C: 6.7

## 2015-02-25 DIAGNOSIS — E039 Hypothyroidism, unspecified: Secondary | ICD-10-CM | POA: Diagnosis not present

## 2015-02-25 DIAGNOSIS — Z6839 Body mass index (BMI) 39.0-39.9, adult: Secondary | ICD-10-CM | POA: Diagnosis not present

## 2015-02-25 DIAGNOSIS — R5382 Chronic fatigue, unspecified: Secondary | ICD-10-CM | POA: Diagnosis not present

## 2015-02-25 DIAGNOSIS — Z13220 Encounter for screening for lipoid disorders: Secondary | ICD-10-CM | POA: Diagnosis not present

## 2015-02-25 DIAGNOSIS — K219 Gastro-esophageal reflux disease without esophagitis: Secondary | ICD-10-CM | POA: Diagnosis not present

## 2015-02-25 DIAGNOSIS — I10 Essential (primary) hypertension: Secondary | ICD-10-CM | POA: Diagnosis not present

## 2015-02-25 DIAGNOSIS — E119 Type 2 diabetes mellitus without complications: Secondary | ICD-10-CM | POA: Diagnosis not present

## 2015-02-25 DIAGNOSIS — M1991 Primary osteoarthritis, unspecified site: Secondary | ICD-10-CM | POA: Diagnosis not present

## 2015-02-25 DIAGNOSIS — Z23 Encounter for immunization: Secondary | ICD-10-CM | POA: Diagnosis not present

## 2015-02-26 ENCOUNTER — Other Ambulatory Visit (INDEPENDENT_AMBULATORY_CARE_PROVIDER_SITE_OTHER): Payer: Self-pay | Admitting: *Deleted

## 2015-02-26 ENCOUNTER — Encounter (INDEPENDENT_AMBULATORY_CARE_PROVIDER_SITE_OTHER): Payer: Self-pay | Admitting: *Deleted

## 2015-02-26 NOTE — Telephone Encounter (Signed)
Patient needs trilyte 

## 2015-02-27 MED ORDER — PEG 3350-KCL-NA BICARB-NACL 420 G PO SOLR: 4000.0000 mL | Freq: Once | ORAL | Status: DC

## 2015-03-13 ENCOUNTER — Ambulatory Visit: Payer: BLUE CROSS/BLUE SHIELD | Admitting: "Endocrinology

## 2015-03-17 ENCOUNTER — Telehealth (INDEPENDENT_AMBULATORY_CARE_PROVIDER_SITE_OTHER): Payer: Self-pay | Admitting: *Deleted

## 2015-03-17 NOTE — Telephone Encounter (Signed)
Referring MD/PCP: sasser   Procedure: tcs  Reason/Indication:  screening  Has patient had this procedure before?  Yes, 2006 -- scanned  If so, when, by whom and where?    Is there a family history of colon cancer?    Who?  What age when diagnosed?    Is patient diabetic?   yes      Does patient have prosthetic heart valve or mechanical valve?  no  Do you have a pacemaker?  no  Has patient ever had endocarditis? no  Has patient had joint replacement within last 12 months?  no  Does patient tend to be constipated or take laxatives? no  Does patient have a history of alcohol/drug use?  no  Is patient on Coumadin, Plavix and/or Aspirin? no  Medications: tylenol 500 mg prn, norvasc 5 mg daily, levothyroxine 112 mcg daily, lisinopril 10 mg daily, metformin 500 mg daily, mvi daily, miralax prn  Allergies: nkda  Medication Adjustment: hold metformin morning of procedure  Procedure date & time: 04/09/15 at 1030

## 2015-03-18 DIAGNOSIS — E118 Type 2 diabetes mellitus with unspecified complications: Secondary | ICD-10-CM | POA: Diagnosis not present

## 2015-03-18 DIAGNOSIS — E038 Other specified hypothyroidism: Secondary | ICD-10-CM | POA: Diagnosis not present

## 2015-03-18 NOTE — Telephone Encounter (Signed)
agree

## 2015-03-30 ENCOUNTER — Encounter: Payer: Self-pay | Admitting: "Endocrinology

## 2015-03-30 ENCOUNTER — Ambulatory Visit (INDEPENDENT_AMBULATORY_CARE_PROVIDER_SITE_OTHER): Payer: Medicare Other | Admitting: "Endocrinology

## 2015-03-30 VITALS — BP 124/79 | HR 82 | Ht 69.0 in | Wt 271.0 lb

## 2015-03-30 DIAGNOSIS — E038 Other specified hypothyroidism: Secondary | ICD-10-CM

## 2015-03-30 DIAGNOSIS — E118 Type 2 diabetes mellitus with unspecified complications: Secondary | ICD-10-CM

## 2015-03-30 DIAGNOSIS — I10 Essential (primary) hypertension: Secondary | ICD-10-CM

## 2015-03-30 MED ORDER — LEVOTHYROXINE SODIUM 112 MCG PO TABS: 112.0000 ug | ORAL_TABLET | Freq: Every day | ORAL | Status: DC

## 2015-03-30 NOTE — Patient Instructions (Signed)

## 2015-03-30 NOTE — Progress Notes (Signed)
Subjective:    Patient ID: Hannah Barrett, female    DOB: [birth year], PCP Manon Hilding, MD   Past Medical History  Diagnosis Date  . Diabetes mellitus without complication ([place])   . Hyperlipidemia   . Hypertension   . Thyroid disease   . Overactive bladder   . PMB (postmenopausal bleeding) 11/18/2014  . Arthritis    Past Surgical History  Procedure Laterality Date  . Colonoscopy    . Cystoscopy    . Robotic assisted total hysterectomy with bilateral salpingo oopherectomy Bilateral 12/18/2014    Procedure: XI ROBOTIC ASSISTED TOTAL HYSTERECTOMY WITH BILATERAL SALPINGO OOPHORECTOMY;  Surgeon: Everitt Amber, MD;  Location: [facility];  Service: Gynecology;  Laterality: Bilateral;   Social History   Social History  . Marital Status: Married    Spouse Name: N/A  . Number of Children: N/A  . Years of Education: N/A   Social History Main Topics  . Smoking status: Former Smoker    Types: Cigarettes    Quit date: 10/27/2004  . Smokeless tobacco: Never Used  . Alcohol Use: No  . Drug Use: No  . Sexual Activity: Not Currently    Birth Control/ Protection: Post-menopausal   Other Topics Concern  . None   Social History Narrative   Outpatient Encounter Prescriptions as of 03/30/2015  Medication Sig  . amLODipine (NORVASC) 5 MG tablet Take 5 mg by mouth daily.  Marland Kitchen levothyroxine (SYNTHROID, LEVOTHROID) 112 MCG tablet Take 1 tablet (112 mcg total) by mouth daily before breakfast.  . metFORMIN (GLUCOPHAGE-XR) 500 MG 24 hr tablet Take 1,000 mg by mouth daily.   . Multiple Vitamins-Minerals (CENTRUM SILVER PO) Take by mouth.  . valsartan (DIOVAN) 40 MG tablet Take 40 mg by mouth daily.  . [DISCONTINUED] levothyroxine (SYNTHROID, LEVOTHROID) 112 MCG tablet Take 112 mcg by mouth every morning.  . [DISCONTINUED] acetaminophen (TYLENOL) 500 MG tablet Take 1,000 mg by mouth every 6 (six) hours as needed for mild pain or headache.  . [DISCONTINUED] ciprofloxacin (CIPRO) 250 MG tablet Take 1  tablet (250 mg total) by mouth 2 (two) times daily.  . [DISCONTINUED] lisinopril (PRINIVIL,ZESTRIL) 10 MG tablet Take 10 mg by mouth daily.  . [DISCONTINUED] metroNIDAZOLE (FLAGYL) 500 MG tablet Take 1 tablet (500 mg total) by mouth 3 (three) times daily.  . [DISCONTINUED] Multiple Vitamin (MULTIVITAMIN) tablet Take 1 tablet by mouth daily.  . [DISCONTINUED] polyethylene glycol-electrolytes (NULYTELY/GOLYTELY) 420 g solution Take 4,000 mLs by mouth once.  . [DISCONTINUED] traMADol (ULTRAM) 50 MG tablet Take 1 tablet (50 mg total) by mouth every 6 (six) hours as needed.   No facility-administered encounter medications on file as of 03/30/2015.   ALLERGIES: No Known Allergies VACCINATION STATUS:  There is no immunization history on file for this patient.  HPI  65 yr old with hx of RAI induced hypothyroidism, and controlled type 2 DM. She is here for follow-up for hypothyroidism controlled type 2 diabetes and hypertension. Since her last visit, she underwent surgery for  ovarian cyst /have had  total hysterectomy and bilateral salpingo-oophorectomy. Her last a1c is higher at 6.7% from  6 %, she is tolerating MTF.  No success in weight loss.  Review of Systems  Constitutional:  -weight gain,  no subjective hyperthermia/hypothermia Eyes: no blurry vision, no xerophthalmia ENT: no sore throat, no nodules palpated in throat, no dysphagia/odynophagia, no hoarseness Cardiovascular: no CP/SOB/palpitations/leg swelling Respiratory: no cough/SOB Gastrointestinal: no N/V/D/C Musculoskeletal: no muscle/joint aches Skin: no rashes Neurological: no tremors/numbness/tingling/dizziness Psychiatric: no  depression/anxiety  Objective:    BP 124/79 mmHg  Pulse 82  Ht '5\' 9"'$  (1.753 m)  Wt 271 lb (122.925 kg)  BMI 40.00 kg/m2  SpO2 95%  Wt Readings from Last 3 Encounters:  03/30/15 271 lb (122.925 kg)  12/18/14 271 lb (122.925 kg)  12/08/14 271 lb (122.925 kg)    Physical Exam  Constitutional:  overweight, in NAD Eyes: PERRLA, EOMI, no exophthalmos ENT: moist mucous membranes, no thyromegaly, no cervical lymphadenopathy Cardiovascular: RRR, No MRG Respiratory: CTA B Gastrointestinal: abdomen soft, NT, ND, BS+ Musculoskeletal: no deformities, strength intact in all 4 Skin: moist, warm, no rashes Neurological: no tremor with outstretched hands, DTR normal in all 4  Results for orders placed or performed in visit on 03/30/15  Hemoglobin A1c  Result Value Ref Range   Hemoglobin A1C 6.7    Complete Blood Count (Most recent): Lab Results  Component Value Date   WBC 12.6* 12/19/2014   HGB 11.1* 12/19/2014   HCT 34.8* 12/19/2014   MCV 93.5 12/19/2014   PLT 260 12/19/2014   Chemistry (most recent): Lab Results  Component Value Date   NA 136 12/19/2014   K 3.9 12/19/2014   CL 103 12/19/2014   CO2 29 12/19/2014   BUN 12 12/19/2014   CREATININE 0.68 12/19/2014   Diabetic Labs (most recent): Lab Results  Component Value Date   HGBA1C 6.7 02/19/2015   HGBA1C 6.1* 12/02/2014   HGBA1C 6.0 05/30/2014      Assessment & Plan:   1. Other specified hypothyroidism: -She is clinically euthyroid. -Her TFTs are c/w appropriate-replacement . Continue LT4   163mg po qam.  - We discussed about correct intake of levothyroxine, at fasting, with water, separated by at least 30 minutes from breakfast, and separated by more than 4 hours from calcium, iron, multivitamins, acid reflux medications (PPIs). -Patient is made aware of the fact that thyroid hormone replacement is needed for life, dose to be adjusted by periodic monitoring of thyroid function tests.  2. Type 2 diabetes mellitus with complication, without long-term current use of insulin ([place] -continue MTF '1000mg'$  po qday. dietary advice given. a1c is higher at  6.7%. she is following wth PJearld Fenton CDE for DM education, given that she is gaining weight. She declined an offer of Qsymia for weight control. she has no  contraindications to its use - Hemoglobin A1c  3. Essential hypertension, benign -Continue amlodipine 5 mg by mouth daily along with lisinopril 10 mg by mouth daily. 4. Osteoporosis: Continue Alendronate for Osteoporosis.    - I advised patient to maintain close follow up with their PCP for primary care needs. Follow up plan: Return in about 6 months (around 09/30/2015) for diabetes, high blood pressure, underactive thyroid, follow up with pre-visit labs.  GGlade Lloyd MD Phone: [phone] Fax: [phone]  03/30/2015, 1:57 PM

## 2015-04-09 ENCOUNTER — Encounter (HOSPITAL_COMMUNITY)
Admission: RE | Disposition: A | Discharge status: Home / Self Care | Payer: Self-pay | Source: Ambulatory Visit | Attending: Internal Medicine

## 2015-04-09 ENCOUNTER — Ambulatory Visit (HOSPITAL_COMMUNITY)
Admission: RE | Admit: 2015-04-09 | Discharge: 2015-04-09 | Disposition: A | Discharge status: Home / Self Care | Payer: Medicare Other | Source: Ambulatory Visit | Attending: Internal Medicine | Admitting: Internal Medicine

## 2015-04-09 ENCOUNTER — Encounter (HOSPITAL_COMMUNITY): Payer: Self-pay

## 2015-04-09 DIAGNOSIS — D122 Benign neoplasm of ascending colon: Secondary | ICD-10-CM | POA: Diagnosis not present

## 2015-04-09 DIAGNOSIS — E079 Disorder of thyroid, unspecified: Secondary | ICD-10-CM | POA: Insufficient documentation

## 2015-04-09 DIAGNOSIS — K648 Other hemorrhoids: Secondary | ICD-10-CM | POA: Insufficient documentation

## 2015-04-09 DIAGNOSIS — E119 Type 2 diabetes mellitus without complications: Secondary | ICD-10-CM | POA: Insufficient documentation

## 2015-04-09 DIAGNOSIS — E785 Hyperlipidemia, unspecified: Secondary | ICD-10-CM | POA: Insufficient documentation

## 2015-04-09 DIAGNOSIS — K6389 Other specified diseases of intestine: Secondary | ICD-10-CM | POA: Diagnosis not present

## 2015-04-09 DIAGNOSIS — M1991 Primary osteoarthritis, unspecified site: Secondary | ICD-10-CM | POA: Insufficient documentation

## 2015-04-09 DIAGNOSIS — Z87891 Personal history of nicotine dependence: Secondary | ICD-10-CM | POA: Insufficient documentation

## 2015-04-09 DIAGNOSIS — Z1211 Encounter for screening for malignant neoplasm of colon: Secondary | ICD-10-CM | POA: Diagnosis not present

## 2015-04-09 DIAGNOSIS — Z79899 Other long term (current) drug therapy: Secondary | ICD-10-CM | POA: Insufficient documentation

## 2015-04-09 DIAGNOSIS — Z7984 Long term (current) use of oral hypoglycemic drugs: Secondary | ICD-10-CM | POA: Diagnosis not present

## 2015-04-09 DIAGNOSIS — K5730 Diverticulosis of large intestine without perforation or abscess without bleeding: Secondary | ICD-10-CM | POA: Diagnosis not present

## 2015-04-09 DIAGNOSIS — I10 Essential (primary) hypertension: Secondary | ICD-10-CM | POA: Insufficient documentation

## 2015-04-09 HISTORY — PX: COLONOSCOPY: SHX5424

## 2015-04-09 HISTORY — PX: POLYPECTOMY: SHX5525

## 2015-04-09 LAB — GLUCOSE, CAPILLARY: GLUCOSE-CAPILLARY: 114 mg/dL — AB (ref 65–99)

## 2015-04-09 SURGERY — COLONOSCOPY
Anesthesia: Moderate Sedation

## 2015-04-09 MED ORDER — SODIUM CHLORIDE 0.9 % IV SOLN
INTRAVENOUS | Status: DC
  Administered 2015-04-09: 10:00:00 via INTRAVENOUS

## 2015-04-09 MED ORDER — MEPERIDINE HCL 50 MG/ML IJ SOLN
INTRAMUSCULAR | Status: DC | PRN
  Administered 2015-04-09 (×2): 25 mg via INTRAVENOUS

## 2015-04-09 MED ORDER — MIDAZOLAM HCL 5 MG/5ML IJ SOLN
INTRAMUSCULAR | Status: AC
  Filled 2015-04-09: qty 10

## 2015-04-09 MED ORDER — STERILE WATER FOR IRRIGATION IR SOLN
Status: DC | PRN
  Administered 2015-04-09: 10:00:00

## 2015-04-09 MED ORDER — MEPERIDINE HCL 50 MG/ML IJ SOLN
INTRAMUSCULAR | Status: AC
  Filled 2015-04-09: qty 1

## 2015-04-09 MED ORDER — MIDAZOLAM HCL 5 MG/5ML IJ SOLN
INTRAMUSCULAR | Status: DC | PRN
  Administered 2015-04-09: 1 mg via INTRAVENOUS
  Administered 2015-04-09 (×3): 2 mg via INTRAVENOUS

## 2015-04-09 NOTE — Op Note (Signed)
[facility] Patient Name: Hannah Barrett Procedure Date: 04/09/2015 9:58 AM MRN: [id number] Date of Birth: [birth year] Attending MD: Hildred Laser , MD CSN: [id number] Age: 65 Admit Type: Outpatient Procedure:                Colonoscopy Indications:              Screening for colorectal malignant neoplasm Providers:                Hildred Laser, MD, Renda Rolls, RN, Isabella Stalling,                            Technician Referring MD:              Medicines:                Meperidine 50 mg IV, Midazolam 7 mg IV Complications:            No immediate complications. Estimated Blood Loss:     Estimated blood loss: none. Procedure:                Pre-Anesthesia Assessment:                           - Prior to the procedure, a History and Physical                            was performed, and patient medications and                            allergies were reviewed. The patient's tolerance of                            previous anesthesia was also reviewed. The risks                            and benefits of the procedure and the sedation                            options and risks were discussed with the patient.                            All questions were answered, and informed consent                            was obtained. Prior Anticoagulants: The patient has                            taken no previous anticoagulant or antiplatelet                            agents. ASA Grade Assessment: II - A patient with                            mild systemic disease. After reviewing the risks  and benefits, the patient was deemed in                            satisfactory condition to undergo the procedure.                           After obtaining informed consent, the colonoscope                            was passed under direct vision. Throughout the                            procedure, the patient's blood pressure, pulse, and   oxygen saturations were monitored continuously. The                            [id number] ([id number]) scope was introduced through                            the anus and advanced to the the cecum, identified                            by appendiceal orifice and ileocecal valve. The                            colonoscopy was somewhat difficult due to a                            tortuous colon. Successful completion of the                            procedure was aided by performing the maneuvers                            documented (below) in this report. During the                            procedure the patient had to be repositioned.                            [Anatomical Structures] photographed. The quality                            of the bowel preparation was excellent. [Anatomical                            Structures]. Scope In: 10:27:09 AM Scope Out: 11:02:49 AM Scope Withdrawal Time: 0 hours 24 minutes 0 seconds  Total Procedure Duration: 0 hours 35 minutes 40 seconds  Findings:      A 16 mm polyp was found in the ascending colon. The polyp was       semi-sessile. The polyp was removed with a hot snare. Resection was       complete, and retrieval was complete. To prevent bleeding after the  polypectomy, one hemostatic clip was successfully placed (MR       conditional).      The entire examined colon appeared normal.      A few small-mouthed diverticula were found in the sigmoid colon.      Internal hemorrhoids were found during retroflexion. The hemorrhoids       were small. Impression:               - One 16 mm polyp in the ascending colon, removed                            with a hot snare. Resected and retrieved. Clip (MR                            conditional) was placed.                           -                           - Diverticulosis in the sigmoid colon.                           - Internal hemorrhoids. Moderate Sedation:      Moderate (conscious)  sedation was administered by the endoscopy nurse       and supervised by the endoscopist. The following parameters were       monitored: oxygen saturation, heart rate, blood pressure, CO2       capnography and response to care. Total physician intraservice time was       40 minutes. Recommendation:           - Patient has a contact number available for                            emergencies. The signs and symptoms of potential                            delayed complications were discussed with the                            patient. Return to normal activities tomorrow.                            Written discharge instructions were provided to the                            patient.                           - Resume previous diet today.                           - Continue present medications.                           - No aspirin, ibuprofen, naproxen, or other  non-steroidal anti-inflammatory drugs for 10 days.                           - Repeat colonoscopy date to be determined after                            pending pathology results are reviewed for                            surveillance. Procedure Code(s):        --- Professional ---                           [phone], Colonoscopy, flexible; with removal of                            tumor(s), polyp(s), or other lesion(s) by snare                            technique                           99152, Moderate sedation services provided by the                            same physician or other qualified health care                            professional performing the diagnostic or                            therapeutic service that the sedation supports,                            requiring the presence of an independent trained                            observer to assist in the monitoring of the                            patient's level of consciousness and physiological                             status; initial 15 minutes of intraservice time,                            patient age 43 years or older                           [phone], Moderate sedation services; each additional                            15 minutes intraservice time                           99153, Moderate  sedation services; each additional                            15 minutes intraservice time Diagnosis Code(s):        --- Professional ---                           Z12.11, Encounter for screening for malignant                            neoplasm of colon                           D12.2, Benign neoplasm of ascending colon                           K64.8, Other hemorrhoids                           K57.30, Diverticulosis of large intestine without                            perforation or abscess without bleeding CPT copyright 2016 American Medical Association. All rights reserved. The codes documented in this report are preliminary and upon coder review may  be revised to meet current compliance requirements. Hildred Laser, MD Hildred Laser, MD 04/09/2015 11:31:11 AM This report has been signed electronically. Number of Addenda: 0

## 2015-04-09 NOTE — H&P (Signed)
Hannah Barrett is an 65 y.o. female.   Chief Complaint: Patient is here for colonoscopy. HPI: Patient is 65 year old Caucasian female who is here for screening colonoscopy. She denies abdominal pain change in bowel habits or rectal bleeding. Last colonoscopy was in June 2006. Family History is negative for CRC.  Past Medical History  Diagnosis Date  . Diabetes mellitus without complication ([place])   . Hyperlipidemia   . Hypertension   . Thyroid disease   . Overactive bladder   . PMB (postmenopausal bleeding) 11/18/2014  . Arthritis     Past Surgical History  Procedure Laterality Date  . Colonoscopy    . Cystoscopy    . Robotic assisted total hysterectomy with bilateral salpingo oopherectomy Bilateral 12/18/2014    Procedure: XI ROBOTIC ASSISTED TOTAL HYSTERECTOMY WITH BILATERAL SALPINGO OOPHORECTOMY;  Surgeon: Everitt Amber, MD;  Location: [facility];  Service: Gynecology;  Laterality: Bilateral;    Family History  Problem Relation Age of Onset  . Congestive Heart Failure Mother   . Arthritis Father   . Other Sister     brain injury after fall  . Cancer Sister     multiple mylemoma  . Obesity Daughter   . Diabetes Maternal Grandmother   . Parkinson's disease Paternal Grandfather    Social History:  reports that she quit smoking about 10 years ago. Her smoking use included Cigarettes. She has never used smokeless tobacco. She reports that she does not drink alcohol or use illicit drugs.  Allergies: No Known Allergies  Medications Prior to Admission  Medication Sig Dispense Refill  . amLODipine (NORVASC) 5 MG tablet Take 5 mg by mouth daily.    Marland Kitchen levothyroxine (SYNTHROID, LEVOTHROID) 112 MCG tablet Take 1 tablet (112 mcg total) by mouth daily before breakfast. 30 tablet 6  . metFORMIN (GLUCOPHAGE-XR) 500 MG 24 hr tablet Take 1,000 mg by mouth daily.   3  . Multiple Vitamins-Minerals (CENTRUM SILVER PO) Take by mouth.    . valsartan (DIOVAN) 40 MG tablet Take 40 mg by mouth daily.       Results for orders placed or performed during the hospital encounter of 04/09/15 (from the past 48 hour(s))  Glucose, capillary     Status: Abnormal   Collection Time: 04/09/15  9:52 AM  Result Value Ref Range   Glucose-Capillary 114 (H) 65 - 99 mg/dL   No results found.  ROS  Blood pressure 144/71, pulse 88, temperature 98.5 F (36.9 C), temperature source Oral, resp. rate 21, SpO2 96 %. Physical Exam  Constitutional: She appears well-developed and well-nourished.  HENT:  Mouth/Throat: Oropharynx is clear and moist.  Eyes: Conjunctivae are normal. No scleral icterus.  Neck: No thyromegaly present.  Cardiovascular: Normal rate, regular rhythm and normal heart sounds.   No murmur heard. Respiratory: Effort normal and breath sounds normal.  GI: Soft. She exhibits no distension and no mass. There is no tenderness.  Musculoskeletal: She exhibits no edema.  Lymphadenopathy:    She has no cervical adenopathy.  Neurological: She is alert.  Skin: Skin is warm and dry.     Assessment/Plan Average risk screening colonoscopy.  Rogene Houston, MD 04/09/2015, 10:15 AM

## 2015-04-09 NOTE — Discharge Instructions (Signed)
No aspirin or NSAIDs for 10 days.  Resume usual medications and diet. No driving for 24 hours. Physician will call with biopsy results.   Colon Polyps Polyps are lumps of extra tissue growing inside the body. Polyps can grow in the large intestine (colon). Most colon polyps are noncancerous (benign). However, some colon polyps can become cancerous over time. Polyps that are larger than a pea may be harmful. To be safe, caregivers remove and test all polyps. CAUSES  Polyps form when mutations in the genes cause your cells to grow and divide even though no more tissue is needed. RISK FACTORS There are a number of risk factors that can increase your chances of getting colon polyps. They include:  Being older than 50 years.  Family history of colon polyps or colon cancer.  Long-term colon diseases, such as colitis or Crohn disease.  Being overweight.  Smoking.  Being inactive.  Drinking too much alcohol. SYMPTOMS  Most small polyps do not cause symptoms. If symptoms are present, they may include:  Blood in the stool. The stool may look dark red or black.  Constipation or diarrhea that lasts longer than 1 week. DIAGNOSIS People often do not know they have polyps until their caregiver finds them during a regular checkup. Your caregiver can use 4 tests to check for polyps:  Digital rectal exam. The caregiver wears gloves and feels inside the rectum. This test would find polyps only in the rectum.  Barium enema. The caregiver puts a liquid called barium into your rectum before taking X-rays of your colon. Barium makes your colon look white. Polyps are dark, so they are easy to see in the X-ray pictures.  Sigmoidoscopy. A thin, flexible tube (sigmoidoscope) is placed into your rectum. The sigmoidoscope has a light and tiny camera in it. The caregiver uses the sigmoidoscope to look at the last third of your colon.  Colonoscopy. This test is like sigmoidoscopy, but the caregiver looks  at the entire colon. This is the most common method for finding and removing polyps. TREATMENT  Any polyps will be removed during a sigmoidoscopy or colonoscopy. The polyps are then tested for cancer. PREVENTION  To help lower your risk of getting more colon polyps:  Eat plenty of fruits and vegetables. Avoid eating fatty foods.  Do not smoke.  Avoid drinking alcohol.  Exercise every day.  Lose weight if recommended by your caregiver.  Eat plenty of calcium and folate. Foods that are rich in calcium include milk, cheese, and broccoli. Foods that are rich in folate include chickpeas, kidney beans, and spinach. HOME CARE INSTRUCTIONS Keep all follow-up appointments as directed by your caregiver. You may need periodic exams to check for polyps. SEEK MEDICAL CARE IF: You notice bleeding during a bowel movement.   This information is not intended to replace advice given to you by your health care provider. Make sure you discuss any questions you have with your health care provider.   Document Released: 09/30/2003 Document Revised: 01/24/2014 Document Reviewed: 03/15/2011 Elsevier Interactive Patient Education 2016 [employer]. Colonoscopy, Care After Refer to this sheet in the next few weeks. These instructions provide you with information on caring for yourself after your procedure. Your health care provider may also give you more specific instructions. Your treatment has been planned according to current medical practices, but problems sometimes occur. Call your health care provider if you have any problems or questions after your procedure. WHAT TO EXPECT AFTER THE PROCEDURE  After your procedure, it  is typical to have the following:  A small amount of blood in your stool.  Moderate amounts of gas and mild abdominal cramping or bloating. HOME CARE INSTRUCTIONS  Do not drive, operate machinery, or sign important documents for 24 hours.  You may shower and resume your regular  physical activities, but move at a slower pace for the first 24 hours.  Take frequent rest periods for the first 24 hours.  Walk around or put a warm pack on your abdomen to help reduce abdominal cramping and bloating.  Drink enough fluids to keep your urine clear or pale yellow.  You may resume your normal diet as instructed by your health care provider. Avoid heavy or fried foods that are hard to digest.  Avoid drinking alcohol for 24 hours or as instructed by your health care provider.  Only take over-the-counter or prescription medicines as directed by your health care provider.  If a tissue sample (biopsy) was taken during your procedure:  Do not take aspirin or blood thinners for 7 days, or as instructed by your health care provider.  Do not drink alcohol for 7 days, or as instructed by your health care provider.  Eat soft foods for the first 24 hours. SEEK MEDICAL CARE IF: You have persistent spotting of blood in your stool 2-3 days after the procedure. SEEK IMMEDIATE MEDICAL CARE IF:  You have more than a small spotting of blood in your stool.  You pass large blood clots in your stool.  Your abdomen is swollen (distended).  You have nausea or vomiting.  You have a fever.  You have increasing abdominal pain that is not relieved with medicine.   This information is not intended to replace advice given to you by your health care provider. Make sure you discuss any questions you have with your health care provider.   Document Released: 08/18/2003 Document Revised: 10/24/2012 Document Reviewed: 09/10/2012 Elsevier Interactive Patient Education [employer].

## 2015-04-13 ENCOUNTER — Encounter (HOSPITAL_COMMUNITY): Payer: Self-pay | Admitting: Internal Medicine

## 2015-05-09 ENCOUNTER — Other Ambulatory Visit: Payer: Self-pay | Admitting: "Endocrinology

## 2015-05-11 DIAGNOSIS — Z1231 Encounter for screening mammogram for malignant neoplasm of breast: Secondary | ICD-10-CM | POA: Diagnosis not present

## 2015-06-22 DIAGNOSIS — E782 Mixed hyperlipidemia: Secondary | ICD-10-CM | POA: Diagnosis not present

## 2015-06-22 DIAGNOSIS — Z13220 Encounter for screening for lipoid disorders: Secondary | ICD-10-CM | POA: Diagnosis not present

## 2015-06-22 DIAGNOSIS — K219 Gastro-esophageal reflux disease without esophagitis: Secondary | ICD-10-CM | POA: Diagnosis not present

## 2015-06-22 DIAGNOSIS — I10 Essential (primary) hypertension: Secondary | ICD-10-CM | POA: Diagnosis not present

## 2015-06-22 DIAGNOSIS — E119 Type 2 diabetes mellitus without complications: Secondary | ICD-10-CM | POA: Diagnosis not present

## 2015-06-26 DIAGNOSIS — E7800 Pure hypercholesterolemia, unspecified: Secondary | ICD-10-CM | POA: Diagnosis not present

## 2015-06-26 DIAGNOSIS — R5382 Chronic fatigue, unspecified: Secondary | ICD-10-CM | POA: Diagnosis not present

## 2015-06-26 DIAGNOSIS — E119 Type 2 diabetes mellitus without complications: Secondary | ICD-10-CM | POA: Diagnosis not present

## 2015-06-26 DIAGNOSIS — K219 Gastro-esophageal reflux disease without esophagitis: Secondary | ICD-10-CM | POA: Diagnosis not present

## 2015-06-26 DIAGNOSIS — M7061 Trochanteric bursitis, right hip: Secondary | ICD-10-CM | POA: Diagnosis not present

## 2015-06-26 DIAGNOSIS — I10 Essential (primary) hypertension: Secondary | ICD-10-CM | POA: Diagnosis not present

## 2015-06-26 DIAGNOSIS — Z1389 Encounter for screening for other disorder: Secondary | ICD-10-CM | POA: Diagnosis not present

## 2015-06-26 DIAGNOSIS — M1991 Primary osteoarthritis, unspecified site: Secondary | ICD-10-CM | POA: Diagnosis not present

## 2015-07-13 ENCOUNTER — Ambulatory Visit: Payer: Medicare Other | Attending: Gynecologic Oncology | Admitting: Gynecologic Oncology

## 2015-07-13 ENCOUNTER — Other Ambulatory Visit (HOSPITAL_BASED_OUTPATIENT_CLINIC_OR_DEPARTMENT_OTHER): Payer: Medicare Other

## 2015-07-13 ENCOUNTER — Encounter: Payer: Self-pay | Admitting: Gynecologic Oncology

## 2015-07-13 VITALS — BP 149/67 | HR 98 | Temp 98.8°F | Resp 19 | Wt 270.3 lb

## 2015-07-13 DIAGNOSIS — C561 Malignant neoplasm of right ovary: Secondary | ICD-10-CM | POA: Insufficient documentation

## 2015-07-13 DIAGNOSIS — N3281 Overactive bladder: Secondary | ICD-10-CM | POA: Diagnosis not present

## 2015-07-13 DIAGNOSIS — C569 Malignant neoplasm of unspecified ovary: Secondary | ICD-10-CM | POA: Insufficient documentation

## 2015-07-13 DIAGNOSIS — Z90722 Acquired absence of ovaries, bilateral: Secondary | ICD-10-CM | POA: Insufficient documentation

## 2015-07-13 DIAGNOSIS — Z87891 Personal history of nicotine dependence: Secondary | ICD-10-CM | POA: Diagnosis not present

## 2015-07-13 DIAGNOSIS — Z79899 Other long term (current) drug therapy: Secondary | ICD-10-CM | POA: Insufficient documentation

## 2015-07-13 DIAGNOSIS — Z833 Family history of diabetes mellitus: Secondary | ICD-10-CM | POA: Diagnosis not present

## 2015-07-13 DIAGNOSIS — E119 Type 2 diabetes mellitus without complications: Secondary | ICD-10-CM | POA: Diagnosis not present

## 2015-07-13 DIAGNOSIS — E785 Hyperlipidemia, unspecified: Secondary | ICD-10-CM | POA: Diagnosis not present

## 2015-07-13 DIAGNOSIS — Z90710 Acquired absence of both cervix and uterus: Secondary | ICD-10-CM | POA: Insufficient documentation

## 2015-07-13 DIAGNOSIS — E079 Disorder of thyroid, unspecified: Secondary | ICD-10-CM | POA: Insufficient documentation

## 2015-07-13 DIAGNOSIS — Z7984 Long term (current) use of oral hypoglycemic drugs: Secondary | ICD-10-CM | POA: Diagnosis not present

## 2015-07-13 DIAGNOSIS — N950 Postmenopausal bleeding: Secondary | ICD-10-CM | POA: Insufficient documentation

## 2015-07-13 DIAGNOSIS — Z808 Family history of malignant neoplasm of other organs or systems: Secondary | ICD-10-CM | POA: Insufficient documentation

## 2015-07-13 DIAGNOSIS — I10 Essential (primary) hypertension: Secondary | ICD-10-CM | POA: Diagnosis not present

## 2015-07-13 DIAGNOSIS — Z8543 Personal history of malignant neoplasm of ovary: Secondary | ICD-10-CM

## 2015-07-13 DIAGNOSIS — Z8249 Family history of ischemic heart disease and other diseases of the circulatory system: Secondary | ICD-10-CM | POA: Diagnosis not present

## 2015-07-13 DIAGNOSIS — Z8542 Personal history of malignant neoplasm of other parts of uterus: Secondary | ICD-10-CM | POA: Diagnosis not present

## 2015-07-13 DIAGNOSIS — M1990 Unspecified osteoarthritis, unspecified site: Secondary | ICD-10-CM | POA: Diagnosis not present

## 2015-07-13 DIAGNOSIS — C541 Malignant neoplasm of endometrium: Secondary | ICD-10-CM

## 2015-07-13 DIAGNOSIS — D122 Benign neoplasm of ascending colon: Secondary | ICD-10-CM | POA: Insufficient documentation

## 2015-07-13 NOTE — Patient Instructions (Signed)
We will contact you with the results of your CA 125 from today.  Plan to follow up with [facility] in six months and Dr. Denman George in one year.  Please call after you see your provider in six months to schedule an appointment with Dr. Denman George.

## 2015-07-13 NOTE — Progress Notes (Signed)
FOLLOW UP NOTE  Chief Complaint:  Chief Complaint  Patient presents with  . endometrial cancer    follow up visit    Assessment:    65 y.o. year old with Stage IC right serous LMP and stage IA grade1 endometrioid endometrial cancer.   S/p robotic hysterectomy, BSO on 12/18/14.  She had low risk features in her tumor and therefore no adjuvant therapy was recommended. Preoperative CA 125 was elevated.   No evidence for recurrence on today's exam  Plan: CA 125 today  Discussed signs and symptoms of recurrence including vaginal bleeding or discharge, leg pain or swelling and changes in bowel or bladder habits.  Return to clinic in 6 months to see Derrek Monaco and in 12 months to see me  HPI:  Hannah Barrett is a very pleasant 65 year old G1P1 who is seen in consultation at the request of Derrek Monaco (NP) for a complex right ovarian cyst. The patient reports having some bloody and mucoid discharge for approximately 1 year. She ignored it after her father died in Apr 15, 2014, but allerted her physicians to this in October, 2016. At that time they performed an [place] of thepelvis on 11/24/14 which showed an 8.7cm uterus with 89m endometrial stripe and a 10cm right ovarian cyst with thin septations. The left ovary was normal.  The CT of the abdomen and pelvis on 11/27/14 featured a 9.2cm complex right ovarian cyst with mural nodularity. There was no ascites or peritoneal nodularity or adenopathy.  CA 125 on 11/25/14 was elevated at 238.4 U/mL.  She then underwent a robotic hysterectomy, BSO, on 161/6/07without complications.  Her postoperative course was uncomplicated.  Her final pathologic diagnosis is a Stage IA Grade 1 endometrioid endometrial cancer with no lymphovascular space invasion, no myometrial invasion, this was incidentally found. The right ovary contained a serous low malignant potential tumor with no surface involvement. Washings were positive.  Interval Hx: She is seen today  for a routine surveillance. She is doing well. She has no complaints including no bleeding or pelvic pain. She had a colonoscopy this year that showed a benign polyp with a plan for follow-up in 3 years.    Past Medical History  Diagnosis Date  . Diabetes mellitus without complication ([place]   . Hyperlipidemia   . Hypertension   . Thyroid disease   . Overactive bladder   . PMB (postmenopausal bleeding) 11/18/2014  . Arthritis    Past Surgical History  Procedure Laterality Date  . Colonoscopy    . Cystoscopy    . Robotic assisted total hysterectomy with bilateral salpingo oopherectomy Bilateral 12/18/2014    Procedure: XI ROBOTIC ASSISTED TOTAL HYSTERECTOMY WITH BILATERAL SALPINGO OOPHORECTOMY;  Surgeon: EEveritt Amber MD;  Location: [facility];  Service: Gynecology;  Laterality: Bilateral;  . Colonoscopy N/A 04/09/2015    Procedure: COLONOSCOPY;  Surgeon: NRogene Houston MD;  Location: [facility];  Service: Endoscopy;  Laterality: N/A;  1030  . Polypectomy  04/09/2015    Procedure: POLYPECTOMY;  Surgeon: NRogene Houston MD;  Location: [facility];  Service: Endoscopy;;  Ascending colon polyp removed via hot snare   Family History  Problem Relation Age of Onset  . Congestive Heart Failure Mother   . Arthritis Father   . Other Sister     brain injury after fall  . Cancer Sister     multiple mylemoma  . Obesity Daughter   . Diabetes Maternal Grandmother   . Parkinson's disease Paternal Grandfather  Social History   Social History  . Marital Status: Married    Spouse Name: N/A  . Number of Children: N/A  . Years of Education: N/A   Occupational History  . Not on file.   Social History Main Topics  . Smoking status: Former Smoker    Types: Cigarettes    Quit date: 10/27/2004  . Smokeless tobacco: Never Used  . Alcohol Use: No  . Drug Use: No  . Sexual Activity: Not Currently    Birth Control/ Protection: Post-menopausal   Other Topics Concern  . Not on file   Social  History Narrative   Current Outpatient Prescriptions on File Prior to Visit  Medication Sig Dispense Refill  . amLODipine (NORVASC) 5 MG tablet Take 5 mg by mouth daily.    Marland Kitchen levothyroxine (SYNTHROID, LEVOTHROID) 112 MCG tablet TAKE 1 TABLET BY MOUTH EVERY MORNING 30 tablet 6  . metFORMIN (GLUCOPHAGE-XR) 500 MG 24 hr tablet Take 1,000 mg by mouth daily.   3  . Multiple Vitamins-Minerals (CENTRUM SILVER PO) Take by mouth.    . valsartan (DIOVAN) 40 MG tablet Take 40 mg by mouth daily.     No current facility-administered medications on file prior to visit.   No Known Allergies   Review of systems: Constitutional:  She has no weight gain or weight loss. She has no fever or chills. Eyes: No blurred vision Ears, Nose, Mouth, Throat: No dizziness, headaches or changes in hearing. No mouth sores. Cardiovascular: No chest pain, palpitations or edema. Respiratory:  No shortness of breath, wheezing or cough Gastrointestinal: She has normal bowel movements without diarrhea or constipation. She denies any nausea or vomiting. She denies blood in her stool or heart burn. Genitourinary:  She denies pelvic pain, pelvic pressure or changes in her urinary function. She has no hematuria, dysuria, or incontinence. No vaginal bleeding and vaginal discharge Musculoskeletal: Denies muscle weakness or joint pains.  Skin:  She has no skin changes, rashes or itching Neurological:  Denies dizziness or headaches. No neuropathy, no numbness or tingling. Psychiatric:  She denies depression or anxiety. Hematologic/Lymphatic:   No easy bruising or bleeding   Physical Exam: Blood pressure 149/67, pulse 98, temperature 98.8 F (37.1 C), temperature source Oral, resp. rate 19, weight 270 lb 4.8 oz (122.607 kg), SpO2 100 %. General: Well dressed, well nourished in no apparent distress.   HEENT:  Normocephalic and atraumatic, no lesions.  Extraocular muscles intact. Sclerae anicteric. Pupils equal, round, reactive. No  mouth sores or ulcers. Thyroid is normal size, not nodular, midline. Abdomen:  Soft, nontender, nondistended.  No palpable masses.  No hepatosplenomegaly.  No ascites. Normal bowel sounds.  No hernias.  Incisions are well healed. Genitourinary: Normal EGBUS  Vaginal cuff intact.  No bleeding or discharge.  Limited exam due to long vagina and narrow upper vagina. No cul de sac fullness. Extremities: No cyanosis, clubbing or edema.  No calf tenderness or erythema. No palpable cords. Psychiatric: Mood and affect are appropriate. Neurological: Awake, alert and oriented x 3. Sensation is intact, no neuropathy.  Musculoskeletal: No pain, normal strength and range of motion.   Donaciano Eva, MD

## 2015-07-14 LAB — CA 125: Cancer Antigen (CA) 125: 5 U/mL (ref 0.0–38.1)

## 2015-07-23 ENCOUNTER — Telehealth: Payer: Self-pay

## 2015-07-23 NOTE — Telephone Encounter (Signed)
Patient called requesting her CA 125 level , CA 125 level ( 5.0 ) normal. The patient was updated with the level and states understanding , denies further questions at this time.

## 2015-09-17 DIAGNOSIS — M5441 Lumbago with sciatica, right side: Secondary | ICD-10-CM | POA: Diagnosis not present

## 2015-09-17 DIAGNOSIS — M722 Plantar fascial fibromatosis: Secondary | ICD-10-CM | POA: Diagnosis not present

## 2015-09-24 DIAGNOSIS — E118 Type 2 diabetes mellitus with unspecified complications: Secondary | ICD-10-CM | POA: Diagnosis not present

## 2015-09-24 DIAGNOSIS — E038 Other specified hypothyroidism: Secondary | ICD-10-CM | POA: Diagnosis not present

## 2015-10-01 ENCOUNTER — Ambulatory Visit (INDEPENDENT_AMBULATORY_CARE_PROVIDER_SITE_OTHER): Payer: Medicare Other | Admitting: "Endocrinology

## 2015-10-01 ENCOUNTER — Encounter: Payer: Self-pay | Admitting: "Endocrinology

## 2015-10-01 VITALS — BP 125/81 | HR 73 | Wt 263.0 lb

## 2015-10-01 DIAGNOSIS — E118 Type 2 diabetes mellitus with unspecified complications: Secondary | ICD-10-CM

## 2015-10-01 DIAGNOSIS — E038 Other specified hypothyroidism: Secondary | ICD-10-CM | POA: Diagnosis not present

## 2015-10-01 DIAGNOSIS — Z6841 Body Mass Index (BMI) 40.0 and over, adult: Secondary | ICD-10-CM

## 2015-10-01 DIAGNOSIS — I10 Essential (primary) hypertension: Secondary | ICD-10-CM

## 2015-10-01 MED ORDER — METFORMIN HCL ER 500 MG PO TB24: 500.0000 mg | ORAL_TABLET | Freq: Every day | ORAL | 6 refills | Status: DC

## 2015-10-01 MED ORDER — LEVOTHYROXINE SODIUM 100 MCG PO TABS: 100.0000 ug | ORAL_TABLET | Freq: Every day | ORAL | 6 refills | Status: DC

## 2015-10-01 NOTE — Progress Notes (Signed)
Subjective:    Patient ID: Hannah Barrett, female    DOB: 1950-05-31, PCP Manon Hilding, MD   Past Medical History:  Diagnosis Date  . Arthritis   . Diabetes mellitus without complication ([place])   . Hyperlipidemia   . Hypertension   . Overactive bladder   . PMB (postmenopausal bleeding) 11/18/2014  . Thyroid disease    Past Surgical History:  Procedure Laterality Date  . COLONOSCOPY    . COLONOSCOPY N/A 04/09/2015   Procedure: COLONOSCOPY;  Surgeon: Rogene Houston, MD;  [facility];  Service: Endoscopy;  Laterality: N/A;  1030  . CYSTOSCOPY    . POLYPECTOMY  04/09/2015   Procedure: POLYPECTOMY;  Surgeon: Rogene Houston, MD;  [facility];  Service: Endoscopy;;  Ascending colon polyp removed via hot snare  . ROBOTIC ASSISTED TOTAL HYSTERECTOMY WITH BILATERAL SALPINGO OOPHERECTOMY Bilateral 12/18/2014   Procedure: XI ROBOTIC ASSISTED TOTAL HYSTERECTOMY WITH BILATERAL SALPINGO OOPHORECTOMY;  Surgeon: Everitt Amber, MD;  Location: [facility];  Service: Gynecology;  Laterality: Bilateral;   Social History   Social History  . Marital status: Married    Spouse name: N/A  . Number of children: N/A  . Years of education: N/A   Social History Main Topics  . Smoking status: Former Smoker    Types: Cigarettes    Quit date: 10/27/2004  . Smokeless tobacco: Never Used  . Alcohol use No  . Drug use: No  . Sexual activity: Not Currently    Birth control/ protection: Post-menopausal   Other Topics Concern  . None   Social History Narrative  . None   Outpatient Encounter Prescriptions as of 10/01/2015  Medication Sig  . amLODipine (NORVASC) 5 MG tablet Take 5 mg by mouth daily.  Marland Kitchen levothyroxine (SYNTHROID, LEVOTHROID) 100 MCG tablet Take 1 tablet (100 mcg total) by mouth daily before breakfast.  . metFORMIN (GLUCOPHAGE-XR) 500 MG 24 hr tablet Take 1 tablet (500 mg total) by mouth daily with breakfast.  . Multiple Vitamins-Minerals (CENTRUM SILVER PO) Take by  mouth.  . valsartan (DIOVAN) 40 MG tablet Take 40 mg by mouth daily.  . [DISCONTINUED] levothyroxine (SYNTHROID, LEVOTHROID) 112 MCG tablet TAKE 1 TABLET BY MOUTH EVERY MORNING  . [DISCONTINUED] metFORMIN (GLUCOPHAGE-XR) 500 MG 24 hr tablet Take 1,000 mg by mouth daily.    No facility-administered encounter medications on file as of 10/01/2015.    ALLERGIES: No Known Allergies VACCINATION STATUS:  There is no immunization history on file for this patient.  HPI  65 yr old with hx of RAI induced hypothyroidism, and controlled type 2 DM. She is here for follow-up for hypothyroidism controlled type 2 diabetes and hypertension. Since her last visit, she underwent surgery for  ovarian cyst /have had  total hysterectomy and bilateral salpingo-oophorectomy. Her last a1c is  Is improving to 6.2% from 6.7%.  she is tolerating MTF.  She denies heat intolerance, palpitations, and tremors. She has lost approximately 8 pounds since last visit.  Review of Systems  Constitutional:  + weight loss,  no subjective hyperthermia/hypothermia Eyes: no blurry vision, no xerophthalmia ENT: no sore throat, no nodules palpated in throat, no dysphagia/odynophagia, no hoarseness Cardiovascular: no CP/SOB/palpitations/leg swelling Respiratory: no cough/SOB Gastrointestinal: no N/V/D/C Musculoskeletal: no muscle/joint aches Skin: no rashes Neurological: no tremors/numbness/tingling/dizziness Psychiatric: no depression/anxiety  Objective:    BP 125/81   Pulse 73   Wt 263 lb (119.3 kg)   BMI 38.84 kg/m   Wt Readings from Last 3 Encounters:  10/01/15 263 lb (119.3 kg)  07/13/15 270 lb 4.8 oz (122.6 kg)  03/30/15 271 lb (122.9 kg)    Physical Exam  Constitutional: overweight, in NAD Eyes: PERRLA, EOMI, no exophthalmos ENT: moist mucous membranes, no thyromegaly, no cervical lymphadenopathy Cardiovascular: RRR, No MRG Respiratory: CTA B Gastrointestinal: abdomen soft, NT, ND, BS+ Musculoskeletal: no  deformities, strength intact in all 4 Skin: moist, warm, no rashes Neurological: no tremor with outstretched hands, DTR normal in all 4  Results for orders placed or performed in visit on 07/13/15  CA 125  Result Value Ref Range   Cancer Antigen (CA) 125 5.0 0.0 - 38.1 U/mL   Complete Blood Count (Most recent): Lab Results  Component Value Date   WBC 12.6 (H) 12/19/2014   HGB 11.1 (L) 12/19/2014   HCT 34.8 (L) 12/19/2014   MCV 93.5 12/19/2014   PLT 260 12/19/2014   Chemistry (most recent): Lab Results  Component Value Date   NA 136 12/19/2014   K 3.9 12/19/2014   CL 103 12/19/2014   CO2 29 12/19/2014   BUN 12 12/19/2014   CREATININE 0.68 12/19/2014   Diabetic Labs (most recent): Lab Results  Component Value Date   HGBA1C 6.7 02/19/2015   HGBA1C 6.1 (A) 12/02/2014   HGBA1C 6.0 05/30/2014      Assessment & Plan:   1. Other specified hypothyroidism: - She has lost significant weight by modified lifestyle. -Her TFTs are c/w over-replacement . I will lower her levothyroxine to 100 g by mouth every morning.    - We discussed about correct intake of levothyroxine, at fasting, with water, separated by at least 30 minutes from breakfast, and separated by more than 4 hours from calcium, iron, multivitamins, acid reflux medications (PPIs). -Patient is made aware of the fact that thyroid hormone replacement is needed for life, dose to be adjusted by periodic monitoring of thyroid function tests.  2. Type 2 diabetes mellitus with complication, without long-term current use of insulin (HCC) - Her A1c has improved to 6.2%. I will lower her metformin to 500 mg extended release once a day.  Given her recent commitment for lifestyle modification, with 8 pounds of weight loss she is motivated to achieve more. she is following wth Jearld Fenton, CDE for DM education. She previously declined an offer of Qsymia for weight control. she has no contraindications to its use   3.  Essential hypertension, benign -Continue amlodipine 5 mg by mouth daily along with lisinopril 10 mg by mouth daily.  4. Osteoporosis: Continue Alendronate for Osteoporosis.   - I advised patient to maintain close follow up with their PCP for primary care needs. Follow up plan: Return in about 6 months (around 03/30/2016) for follow up with pre-visit labs.  Glade Lloyd, MD Phone: [phone]  Fax: [phone]   10/01/2015, 2:24 PM

## 2015-10-12 DIAGNOSIS — H5203 Hypermetropia, bilateral: Secondary | ICD-10-CM | POA: Diagnosis not present

## 2015-10-12 DIAGNOSIS — E119 Type 2 diabetes mellitus without complications: Secondary | ICD-10-CM | POA: Diagnosis not present

## 2015-10-12 DIAGNOSIS — Z7984 Long term (current) use of oral hypoglycemic drugs: Secondary | ICD-10-CM | POA: Diagnosis not present

## 2015-10-12 DIAGNOSIS — H2513 Age-related nuclear cataract, bilateral: Secondary | ICD-10-CM | POA: Diagnosis not present

## 2015-10-15 DIAGNOSIS — M6701 Short Achilles tendon (acquired), right ankle: Secondary | ICD-10-CM | POA: Diagnosis not present

## 2015-10-15 DIAGNOSIS — M722 Plantar fascial fibromatosis: Secondary | ICD-10-CM | POA: Diagnosis not present

## 2015-10-15 DIAGNOSIS — M5441 Lumbago with sciatica, right side: Secondary | ICD-10-CM | POA: Diagnosis not present

## 2015-10-20 ENCOUNTER — Encounter: Payer: Self-pay | Admitting: "Endocrinology

## 2015-11-04 DIAGNOSIS — E119 Type 2 diabetes mellitus without complications: Secondary | ICD-10-CM | POA: Diagnosis not present

## 2015-11-04 DIAGNOSIS — E7800 Pure hypercholesterolemia, unspecified: Secondary | ICD-10-CM | POA: Diagnosis not present

## 2015-11-04 DIAGNOSIS — E039 Hypothyroidism, unspecified: Secondary | ICD-10-CM | POA: Diagnosis not present

## 2015-11-04 DIAGNOSIS — I10 Essential (primary) hypertension: Secondary | ICD-10-CM | POA: Diagnosis not present

## 2015-11-04 DIAGNOSIS — K219 Gastro-esophageal reflux disease without esophagitis: Secondary | ICD-10-CM | POA: Diagnosis not present

## 2015-11-04 DIAGNOSIS — E782 Mixed hyperlipidemia: Secondary | ICD-10-CM | POA: Diagnosis not present

## 2015-11-06 DIAGNOSIS — I10 Essential (primary) hypertension: Secondary | ICD-10-CM | POA: Diagnosis not present

## 2015-11-06 DIAGNOSIS — Z23 Encounter for immunization: Secondary | ICD-10-CM | POA: Diagnosis not present

## 2015-11-06 DIAGNOSIS — R5382 Chronic fatigue, unspecified: Secondary | ICD-10-CM | POA: Diagnosis not present

## 2015-11-06 DIAGNOSIS — E7800 Pure hypercholesterolemia, unspecified: Secondary | ICD-10-CM | POA: Diagnosis not present

## 2015-11-06 DIAGNOSIS — M1991 Primary osteoarthritis, unspecified site: Secondary | ICD-10-CM | POA: Diagnosis not present

## 2015-11-06 DIAGNOSIS — Z6838 Body mass index (BMI) 38.0-38.9, adult: Secondary | ICD-10-CM | POA: Diagnosis not present

## 2015-11-06 DIAGNOSIS — E119 Type 2 diabetes mellitus without complications: Secondary | ICD-10-CM | POA: Diagnosis not present

## 2015-11-06 DIAGNOSIS — K219 Gastro-esophageal reflux disease without esophagitis: Secondary | ICD-10-CM | POA: Diagnosis not present

## 2015-11-23 ENCOUNTER — Telehealth: Payer: Self-pay | Admitting: "Endocrinology

## 2015-11-23 MED ORDER — METFORMIN HCL ER 500 MG PO TB24: 500.0000 mg | ORAL_TABLET | Freq: Every day | ORAL | 6 refills | Status: DC

## 2015-11-23 NOTE — Telephone Encounter (Signed)
Needs metformin filled

## 2015-11-26 ENCOUNTER — Other Ambulatory Visit: Payer: Self-pay

## 2015-11-26 MED ORDER — METFORMIN HCL ER 500 MG PO TB24: 500.0000 mg | ORAL_TABLET | Freq: Every day | ORAL | 6 refills | Status: DC

## 2015-11-26 NOTE — Telephone Encounter (Signed)
2nd fax sent to [facility]

## 2015-12-22 DIAGNOSIS — G4733 Obstructive sleep apnea (adult) (pediatric): Secondary | ICD-10-CM | POA: Diagnosis not present

## 2015-12-22 DIAGNOSIS — Z6838 Body mass index (BMI) 38.0-38.9, adult: Secondary | ICD-10-CM | POA: Diagnosis not present

## 2015-12-23 DIAGNOSIS — Z7984 Long term (current) use of oral hypoglycemic drugs: Secondary | ICD-10-CM | POA: Diagnosis not present

## 2015-12-23 DIAGNOSIS — Z79899 Other long term (current) drug therapy: Secondary | ICD-10-CM | POA: Diagnosis not present

## 2015-12-23 DIAGNOSIS — E039 Hypothyroidism, unspecified: Secondary | ICD-10-CM | POA: Diagnosis not present

## 2015-12-23 DIAGNOSIS — Z780 Asymptomatic menopausal state: Secondary | ICD-10-CM | POA: Diagnosis not present

## 2015-12-23 DIAGNOSIS — E119 Type 2 diabetes mellitus without complications: Secondary | ICD-10-CM | POA: Diagnosis not present

## 2015-12-23 DIAGNOSIS — Z87891 Personal history of nicotine dependence: Secondary | ICD-10-CM | POA: Diagnosis not present

## 2015-12-23 DIAGNOSIS — Z8262 Family history of osteoporosis: Secondary | ICD-10-CM | POA: Diagnosis not present

## 2015-12-23 DIAGNOSIS — M85852 Other specified disorders of bone density and structure, left thigh: Secondary | ICD-10-CM | POA: Diagnosis not present

## 2015-12-23 DIAGNOSIS — I10 Essential (primary) hypertension: Secondary | ICD-10-CM | POA: Diagnosis not present

## 2015-12-28 ENCOUNTER — Ambulatory Visit: Payer: Self-pay | Admitting: Adult Health

## 2016-01-05 ENCOUNTER — Encounter: Payer: Self-pay | Admitting: Adult Health

## 2016-01-05 ENCOUNTER — Ambulatory Visit (INDEPENDENT_AMBULATORY_CARE_PROVIDER_SITE_OTHER): Payer: Medicare Other | Admitting: Adult Health

## 2016-01-05 ENCOUNTER — Other Ambulatory Visit (HOSPITAL_COMMUNITY)
Admission: RE | Admit: 2016-01-05 | Discharge: 2016-01-05 | Disposition: A | Discharge status: Home / Self Care | Payer: Medicare Other | Source: Ambulatory Visit | Attending: Adult Health | Admitting: Adult Health

## 2016-01-05 VITALS — BP 148/60 | HR 72 | Ht 68.0 in | Wt 262.0 lb

## 2016-01-05 DIAGNOSIS — Z01411 Encounter for gynecological examination (general) (routine) with abnormal findings: Secondary | ICD-10-CM | POA: Diagnosis not present

## 2016-01-05 DIAGNOSIS — Z1272 Encounter for screening for malignant neoplasm of vagina: Secondary | ICD-10-CM | POA: Diagnosis not present

## 2016-01-05 DIAGNOSIS — Z8542 Personal history of malignant neoplasm of other parts of uterus: Secondary | ICD-10-CM | POA: Diagnosis not present

## 2016-01-05 DIAGNOSIS — Z1151 Encounter for screening for human papillomavirus (HPV): Secondary | ICD-10-CM | POA: Insufficient documentation

## 2016-01-05 DIAGNOSIS — Z90710 Acquired absence of both cervix and uterus: Secondary | ICD-10-CM | POA: Diagnosis not present

## 2016-01-05 DIAGNOSIS — Z08 Encounter for follow-up examination after completed treatment for malignant neoplasm: Secondary | ICD-10-CM

## 2016-01-05 HISTORY — DX: Personal history of malignant neoplasm of other parts of uterus: Z85.42

## 2016-01-05 NOTE — Addendum Note (Signed)
Addended by: Linton Rump on: 01/05/2016 04:56 PM   Modules accepted: Orders

## 2016-01-05 NOTE — Patient Instructions (Signed)
Pap with me in 1 year

## 2016-01-05 NOTE — Progress Notes (Signed)
Subjective:     Patient ID: Hannah Barrett, female   DOB: 1950-01-28, 65 y.o.   MRN: [id number]  HPI Hannah Barrett is a 65 year old white female, married in for pap sp robotic hysterectomy and BSO on 12/18/14 for stage IC right serous LMP and stage IA grade 1 endometrioid endometrial  Cancer.She saw Dr Denman George in June and CA125 was 5. PCP is Amy Boyd,PA at [facility].  Review of Systems Patient denies any headaches, hearing loss, fatigue, blurred vision, shortness of breath, chest pain, abdominal pain, problems with bowel movements, urination, or intercourse. No mood swings.No vaginal bleeding or discharge. Has arthritis.    Objective:   Physical Exam BP (!) 148/60 (BP Location: Left Arm, Patient Position: Sitting, Cuff Size: Large)   Pulse 72   Ht '5\' 8"'$  (1.727 m)   Wt 262 lb (118.8 kg)   BMI 39.84 kg/m    PHQ 2 score 0. Skin warm and dry.Pelvic: external genitalia is normal in appearance no lesions, vagina: is pale pink with fair moisture and rugae,urethra has no lesions or masses noted, cervix and uterus are absent,pap with HPV performed, adnexa: no masses or tenderness noted. Bladder is non tender and no masses felt.Rectal deferred had colonoscopy in March with polyp removal by Dr Laural Golden   Assessment:     1. Vaginal Pap smear following hysterectomy for malignancy   2. History of endometrial cancer       Plan:     Thin prep pap with HPV sent Return in 1 year for pap, will perform for 10 years  See Dr Denman George in June

## 2016-01-08 LAB — CYTOLOGY - PAP
DIAGNOSIS: NEGATIVE
HPV (WINDOPATH): NOT DETECTED

## 2016-03-01 ENCOUNTER — Encounter: Payer: Self-pay | Admitting: Cardiology

## 2016-03-01 DIAGNOSIS — G4733 Obstructive sleep apnea (adult) (pediatric): Secondary | ICD-10-CM | POA: Diagnosis not present

## 2016-03-01 DIAGNOSIS — K219 Gastro-esophageal reflux disease without esophagitis: Secondary | ICD-10-CM | POA: Diagnosis not present

## 2016-03-01 DIAGNOSIS — E039 Hypothyroidism, unspecified: Secondary | ICD-10-CM | POA: Diagnosis not present

## 2016-03-01 DIAGNOSIS — I10 Essential (primary) hypertension: Secondary | ICD-10-CM | POA: Diagnosis not present

## 2016-03-01 DIAGNOSIS — E7800 Pure hypercholesterolemia, unspecified: Secondary | ICD-10-CM | POA: Diagnosis not present

## 2016-03-01 DIAGNOSIS — E782 Mixed hyperlipidemia: Secondary | ICD-10-CM | POA: Diagnosis not present

## 2016-03-01 DIAGNOSIS — E119 Type 2 diabetes mellitus without complications: Secondary | ICD-10-CM | POA: Diagnosis not present

## 2016-03-04 DIAGNOSIS — R5382 Chronic fatigue, unspecified: Secondary | ICD-10-CM | POA: Diagnosis not present

## 2016-03-04 DIAGNOSIS — E119 Type 2 diabetes mellitus without complications: Secondary | ICD-10-CM | POA: Diagnosis not present

## 2016-03-04 DIAGNOSIS — M1711 Unilateral primary osteoarthritis, right knee: Secondary | ICD-10-CM | POA: Diagnosis not present

## 2016-03-04 DIAGNOSIS — K219 Gastro-esophageal reflux disease without esophagitis: Secondary | ICD-10-CM | POA: Diagnosis not present

## 2016-03-04 DIAGNOSIS — R002 Palpitations: Secondary | ICD-10-CM | POA: Diagnosis not present

## 2016-03-04 DIAGNOSIS — I10 Essential (primary) hypertension: Secondary | ICD-10-CM | POA: Diagnosis not present

## 2016-03-04 DIAGNOSIS — M7061 Trochanteric bursitis, right hip: Secondary | ICD-10-CM | POA: Diagnosis not present

## 2016-03-04 DIAGNOSIS — E7800 Pure hypercholesterolemia, unspecified: Secondary | ICD-10-CM | POA: Diagnosis not present

## 2016-03-16 DIAGNOSIS — E038 Other specified hypothyroidism: Secondary | ICD-10-CM | POA: Diagnosis not present

## 2016-03-16 DIAGNOSIS — E118 Type 2 diabetes mellitus with unspecified complications: Secondary | ICD-10-CM | POA: Diagnosis not present

## 2016-03-16 DIAGNOSIS — Z6841 Body Mass Index (BMI) 40.0 and over, adult: Secondary | ICD-10-CM | POA: Diagnosis not present

## 2016-03-17 ENCOUNTER — Encounter: Payer: Self-pay | Admitting: *Deleted

## 2016-03-18 ENCOUNTER — Ambulatory Visit (INDEPENDENT_AMBULATORY_CARE_PROVIDER_SITE_OTHER): Payer: Medicare Other | Admitting: Cardiology

## 2016-03-18 ENCOUNTER — Encounter: Payer: Self-pay | Admitting: Cardiology

## 2016-03-18 VITALS — BP 146/76 | HR 81 | Ht 68.0 in | Wt 256.0 lb

## 2016-03-18 DIAGNOSIS — I10 Essential (primary) hypertension: Secondary | ICD-10-CM

## 2016-03-18 DIAGNOSIS — R002 Palpitations: Secondary | ICD-10-CM | POA: Diagnosis not present

## 2016-03-18 DIAGNOSIS — R011 Cardiac murmur, unspecified: Secondary | ICD-10-CM

## 2016-03-18 NOTE — Patient Instructions (Signed)
Medication Instructions:  Continue all current medications.  Labwork: none  Testing/Procedures:  Your physician has requested that you have an echocardiogram. Echocardiography is a painless test that uses sound waves to create images of your heart. It provides your doctor with information about the size and shape of your heart and how well your heart's chambers and valves are working. This procedure takes approximately one hour. There are no restrictions for this procedure.  Your physician has recommended that you wear a 7 day event monitor. Event monitors are medical devices that record the heart's electrical activity. Doctors most often [place] these monitors to diagnose arrhythmias. Arrhythmias are problems with the speed or rhythm of the heartbeat. The monitor is a small, portable device. You can wear one while you do your normal daily activities. This is usually used to diagnose what is causing palpitations/syncope (passing out).  Office will contact with results via phone or letter.    Follow-Up: Pending test results.    Any Other Special Instructions Will Be Listed Below (If Applicable).  If you need a refill on your cardiac medications before your next appointment, please call your pharmacy.

## 2016-03-18 NOTE — Progress Notes (Signed)
Clinical Summary Hannah Barrett is a 66 y.o.female seen as new patient, she is referred by Dr Quintin Alto   1. Palpitations - symptoms started about mid January. Mainly occurs at night, rarely during the day. Feeling of heart pounding, no other associated symptoms. Last few minutes, longest up to 20 minutes. Occurs 2-4 times a week. Has some decreasing in frequency and severity - coffee occasional, green tea x 1 in the morning, no sodas, no energy drinks, no alcohol.  - recent decrease in thyroid meds in October.    2. HTN - cough on ACE-I. Changed to ARB by pcp.   3. OSA - on cpap  Past Medical History:  Diagnosis Date  . Arthritis   . Diabetes mellitus without complication ([place])   . History of endometrial cancer 01/05/2016  . Hyperlipidemia   . Hypertension   . Overactive bladder   . PMB (postmenopausal bleeding) 11/18/2014  . Thyroid disease      No Known Allergies   Current Outpatient Prescriptions  Medication Sig Dispense Refill  . amLODipine (NORVASC) 5 MG tablet Take 5 mg by mouth daily.    Marland Kitchen levothyroxine (SYNTHROID, LEVOTHROID) 112 MCG tablet Take 112 mcg by mouth daily before breakfast.    . metFORMIN (GLUCOPHAGE-XR) 500 MG 24 hr tablet Take 1 tablet (500 mg total) by mouth daily with breakfast. 30 tablet 6  . Multiple Vitamins-Minerals (CENTRUM SILVER PO) Take by mouth daily.     . valsartan (DIOVAN) 40 MG tablet Take 40 mg by mouth daily.     No current facility-administered medications for this visit.      Past Surgical History:  Procedure Laterality Date  . COLONOSCOPY    . COLONOSCOPY N/A 04/09/2015   Procedure: COLONOSCOPY;  Surgeon: Rogene Houston, MD;  [facility];  Service: Endoscopy;  Laterality: N/A;  1030  . CYSTOSCOPY    . POLYPECTOMY  04/09/2015   Procedure: POLYPECTOMY;  Surgeon: Rogene Houston, MD;  [facility];  Service: Endoscopy;;  Ascending colon polyp removed via hot snare  . ROBOTIC ASSISTED TOTAL HYSTERECTOMY  WITH BILATERAL SALPINGO OOPHERECTOMY Bilateral 12/18/2014   Procedure: XI ROBOTIC ASSISTED TOTAL HYSTERECTOMY WITH BILATERAL SALPINGO OOPHORECTOMY;  Surgeon: Everitt Amber, MD;  Location[facility];  Service: Gynecology;  Laterality: Bilateral;     No Known Allergies    Family History  Problem Relation Age of Onset  . Congestive Heart Failure Mother   . Arthritis Father   . Other Father     killed in [place]  . Other Sister     brain injury after fall  . Cancer Sister     multiple mylemoma  . Obesity Daughter   . Diabetes Maternal Grandmother   . Parkinson's disease Paternal Grandfather      Social History Hannah Barrett reports that she quit smoking about 11 years ago. Her smoking use included Cigarettes. She has never used smokeless tobacco. Hannah Barrett reports that she does not drink alcohol.   Review of Systems CONSTITUTIONAL: No weight loss, fever, chills, weakness or fatigue.  HEENT: Eyes: No visual loss, blurred vision, double vision or yellow sclerae.No hearing loss, sneezing, congestion, runny nose or sore throat.  SKIN: No rash or itching.  CARDIOVASCULAR: per hpi RESPIRATORY: No shortness of breath, cough or sputum.  GASTROINTESTINAL: No anorexia, nausea, vomiting or diarrhea. No abdominal pain or blood.  GENITOURINARY: No burning on urination, no polyuria NEUROLOGICAL: No headache, dizziness, syncope, paralysis, ataxia, numbness or tingling in  the extremities. No change in bowel or bladder control.  MUSCULOSKELETAL: No muscle, back pain, joint pain or stiffness.  LYMPHATICS: No enlarged nodes. No history of splenectomy.  PSYCHIATRIC: No history of depression or anxiety.  ENDOCRINOLOGIC: No reports of sweating, cold or heat intolerance. No polyuria or polydipsia.  Marland Kitchen   Physical Examination Vitals:   03/18/16 1330 03/18/16 1343  BP: (!) 156/78 (!) 146/76  Pulse: 80 81   Vitals:   03/18/16 1330  Weight: 256 lb (116.1 kg)  Height: '5\' 8"'$  (1.727 m)    Gen: resting  comfortably, no acute distress HEENT: no scleral icterus, pupils equal round and reactive, no palptable cervical adenopathy,  CV: RRR, 3/6 systolic murmur apex, no jvd Resp: Clear to auscultation bilaterally GI: abdomen is soft, non-tender, non-distended, normal bowel sounds, no hepatosplenomegaly MSK: extremities are warm, no edema.  Skin: warm, no rash Neuro:  no focal deficits Psych: appropriate affect     Assessment and Plan  1. Palpitations - EKG in clinic today shows NSR - we will obtain 1 week event monitor to further evaluate  2. Heart murmur - obtain echo  3. HTN - elevated in clinic. Will monitor at this time. May require av nodal agent for palpitations in the near future which will also help contorl bp.    Arnoldo Lenis, M.D.

## 2016-03-23 ENCOUNTER — Ambulatory Visit (INDEPENDENT_AMBULATORY_CARE_PROVIDER_SITE_OTHER): Payer: Medicare Other | Admitting: "Endocrinology

## 2016-03-23 ENCOUNTER — Encounter: Payer: Self-pay | Admitting: "Endocrinology

## 2016-03-23 VITALS — BP 142/75 | HR 80 | Ht 68.0 in | Wt 253.0 lb

## 2016-03-23 DIAGNOSIS — I10 Essential (primary) hypertension: Secondary | ICD-10-CM

## 2016-03-23 DIAGNOSIS — E118 Type 2 diabetes mellitus with unspecified complications: Secondary | ICD-10-CM

## 2016-03-23 DIAGNOSIS — E038 Other specified hypothyroidism: Secondary | ICD-10-CM

## 2016-03-23 DIAGNOSIS — M810 Age-related osteoporosis without current pathological fracture: Secondary | ICD-10-CM | POA: Insufficient documentation

## 2016-03-23 MED ORDER — METFORMIN HCL ER 500 MG PO TB24: 500.0000 mg | ORAL_TABLET | Freq: Every day | ORAL | 6 refills | Status: DC

## 2016-03-23 MED ORDER — LEVOTHYROXINE SODIUM 100 MCG PO TABS: 100.0000 ug | ORAL_TABLET | Freq: Every day | ORAL | 1 refills | Status: DC

## 2016-03-23 NOTE — Progress Notes (Signed)
Subjective:    Patient ID: Hannah Barrett, female    DOB: 1950/06/17, PCP Manon Hilding, MD   Past Medical History:  Diagnosis Date  . Arthritis   . Diabetes mellitus without complication ([place])   . History of endometrial cancer 01/05/2016  . Hyperlipidemia   . Hypertension   . Overactive bladder   . PMB (postmenopausal bleeding) 11/18/2014  . Thyroid disease    Past Surgical History:  Procedure Laterality Date  . COLONOSCOPY    . COLONOSCOPY N/A 04/09/2015   Procedure: COLONOSCOPY;  Surgeon: Rogene Houston, MD;  [facility];  Service: Endoscopy;  Laterality: N/A;  1030  . CYSTOSCOPY    . POLYPECTOMY  04/09/2015   Procedure: POLYPECTOMY;  Surgeon: Rogene Houston, MD;  [facility];  Service: Endoscopy;;  Ascending colon polyp removed via hot snare  . ROBOTIC ASSISTED TOTAL HYSTERECTOMY WITH BILATERAL SALPINGO OOPHERECTOMY Bilateral 12/18/2014   Procedure: XI ROBOTIC ASSISTED TOTAL HYSTERECTOMY WITH BILATERAL SALPINGO OOPHORECTOMY;  Surgeon: Everitt Amber, MD;  [facility];  Service: Gynecology;  Laterality: Bilateral;   Social History   Social History  . Marital status: Married    Spouse name: N/A  . Number of children: N/A  . Years of education: N/A   Social History Main Topics  . Smoking status: Former Smoker    Packs/day: 2.00    Years: 36.00    Types: Cigarettes    Start date: 03/03/1968    Quit date: 10/27/2004  . Smokeless tobacco: Never Used  . Alcohol use No  . Drug use: No  . Sexual activity: Yes    Birth control/ protection: Post-menopausal, Surgical     Comment: hyst   Other Topics Concern  . None   Social History Narrative  . None   Outpatient Encounter Prescriptions as of 03/23/2016  Medication Sig  . amLODipine (NORVASC) 5 MG tablet Take 5 mg by mouth daily.  Marland Kitchen levothyroxine (SYNTHROID, LEVOTHROID) 100 MCG tablet Take 1 tablet (100 mcg total) by mouth daily.  . metFORMIN (GLUCOPHAGE-XR) 500 MG 24 hr tablet Take 1 tablet  (500 mg total) by mouth daily with breakfast.  . Multiple Vitamins-Minerals (CENTRUM SILVER PO) Take by mouth daily.   . valsartan (DIOVAN) 40 MG tablet Take 40 mg by mouth daily.  . [DISCONTINUED] levothyroxine (SYNTHROID, LEVOTHROID) 100 MCG tablet Take 1 tablet by mouth daily.  . [DISCONTINUED] metFORMIN (GLUCOPHAGE-XR) 500 MG 24 hr tablet Take 1 tablet (500 mg total) by mouth daily with breakfast.   No facility-administered encounter medications on file as of 03/23/2016.    ALLERGIES: No Known Allergies VACCINATION STATUS:  There is no immunization history on file for this patient.  Hypertension   Diabetes     66 yr old with hx of RAI induced hypothyroidism, and controlled type 2 DM. She is here for follow-up for hypothyroidism controlled type 2 diabetes and hypertension. Last year, she underwent surgery for  ovarian cyst /have had  total hysterectomy and bilateral salpingo-oophorectomy. - She is on levothyroxine 100 g by mouth every morning. She is compliant. Her last a1c is  Is improving to 6.2% from 6.7%.  she is tolerating MTF.  She denies heat intolerance, palpitations, and tremors. She has lost approximately 8 pounds since last visit.  Review of Systems  Constitutional:  + weight loss,  no subjective hyperthermia/hypothermia Eyes: no blurry vision, no xerophthalmia ENT: no sore throat, no nodules palpated in throat, no dysphagia/odynophagia, no hoarseness Cardiovascular: no CP/SOB/palpitations/leg swelling Respiratory:  no cough/SOB Gastrointestinal: no N/V/D/C Musculoskeletal: no muscle/joint aches Skin: no rashes Neurological: no tremors/numbness/tingling/dizziness Psychiatric: no depression/anxiety  Objective:    BP (!) 142/75   Pulse 80   Ht '5\' 8"'$  (1.727 m)   Wt 253 lb (114.8 kg)   BMI 38.47 kg/m   Wt Readings from Last 3 Encounters:  03/23/16 253 lb (114.8 kg)  03/18/16 256 lb (116.1 kg)  01/05/16 262 lb (118.8 kg)    Physical Exam  Constitutional:  overweight, in NAD Eyes: PERRLA, EOMI, no exophthalmos ENT: moist mucous membranes, no thyromegaly, no cervical lymphadenopathy Cardiovascular: RRR, No MRG Respiratory: CTA B Gastrointestinal: abdomen soft, NT, ND, BS+ Musculoskeletal: no deformities, strength intact in all 4 Skin: moist, warm, no rashes Neurological: no tremor with outstretched hands, DTR normal in all 4  Results for orders placed or performed in visit on 01/05/16  Cytology - PAP  Result Value Ref Range   Adequacy Satisfactory for evaluation.    Diagnosis      NEGATIVE FOR INTRAEPITHELIAL LESIONS OR MALIGNANCY.   HPV NOT DETECTED    Material Submitted Vaginal Pap [ThinPrep Imaged]    Complete Blood Count (Most recent): Lab Results  Component Value Date   WBC 12.6 (H) 12/19/2014   HGB 11.1 (L) 12/19/2014   HCT 34.8 (L) 12/19/2014   MCV 93.5 12/19/2014   PLT 260 12/19/2014   Chemistry (most recent): Lab Results  Component Value Date   NA 136 12/19/2014   K 3.9 12/19/2014   CL 103 12/19/2014   CO2 29 12/19/2014   BUN 12 12/19/2014   CREATININE 0.68 12/19/2014   Diabetic Labs (most recent): Lab Results  Component Value Date   HGBA1C 6.7 02/19/2015   HGBA1C 6.1 (A) 12/02/2014   HGBA1C 6.0 05/30/2014      Assessment & Plan:   1. Other specified hypothyroidism: - She has lost significant weight by modified lifestyle. -Her TFTs are Consistent with appropriate replacement . I will continue her levothyroxine to 100 g by mouth every morning.    - We discussed about correct intake of levothyroxine, at fasting, with water, separated by at least 30 minutes from breakfast, and separated by more than 4 hours from calcium, iron, multivitamins, acid reflux medications (PPIs). -Patient is made aware of the fact that thyroid hormone replacement is needed for life, dose to be adjusted by periodic monitoring of thyroid function tests.  2. Type 2 diabetes mellitus with complication, without long-term current use  of insulin (HCC) - Her recent A1c has improved to 6.2%. I will continue her metformin at  500 mg extended release once a day.  Given her recent commitment for lifestyle modification, with 18 pounds of weight loss she is motivated to achieve more. she is following wth Jearld Fenton, CDE for DM education. She previously declined an offer of Qsymia for weight control. she has no contraindications to its use   3. Essential hypertension, benign - Uncontrolled. Continue amlodipine 5 mg by mouth daily along with lisinopril 10 mg by mouth daily.  4. Osteoporosis:  Since last visit, she has decided to discontinue Alendronate because a family member has had side effects from it. She states that her primary care doctor's office DEXA scan was "normal".   - I advised patient to maintain close follow up with their PCP for primary care needs. Follow up plan: Return in about 6 months (around 09/23/2016) for follow up with pre-visit labs.  Glade Lloyd, MD Phone: [phone]  Fax: [phone]   03/23/2016, 2:40  PM

## 2016-03-25 ENCOUNTER — Telehealth: Payer: Self-pay | Admitting: Cardiology

## 2016-03-25 NOTE — Telephone Encounter (Signed)
Pt says she did receive call from [employer] last Friday when monitor was enrolled on [employer] website - gave pt [employer] contact information.

## 2016-03-25 NOTE — Telephone Encounter (Signed)
Has not received monitor yet.  Asking about when she should be expecting to receive it .

## 2016-03-29 DIAGNOSIS — R002 Palpitations: Secondary | ICD-10-CM | POA: Diagnosis not present

## 2016-03-30 ENCOUNTER — Ambulatory Visit (INDEPENDENT_AMBULATORY_CARE_PROVIDER_SITE_OTHER): Payer: Medicare Other

## 2016-03-30 DIAGNOSIS — R002 Palpitations: Secondary | ICD-10-CM | POA: Diagnosis not present

## 2016-04-06 ENCOUNTER — Other Ambulatory Visit: Payer: Self-pay

## 2016-04-06 ENCOUNTER — Ambulatory Visit (INDEPENDENT_AMBULATORY_CARE_PROVIDER_SITE_OTHER): Payer: Medicare Other

## 2016-04-06 DIAGNOSIS — R011 Cardiac murmur, unspecified: Secondary | ICD-10-CM | POA: Diagnosis not present

## 2016-04-08 ENCOUNTER — Telehealth: Payer: Self-pay | Admitting: *Deleted

## 2016-04-08 NOTE — Telephone Encounter (Signed)
Pt aware - waiting on event monitor results - explained that we would call with those results we just received yesterday - also wanted to if she needed to f/u - routed to pcp

## 2016-04-08 NOTE — Telephone Encounter (Signed)
-----   Message from Arnoldo Lenis, MD sent at 04/07/2016  1:09 PM EDT ----- Echo looks good. All heart valves have normal function   Zandra Abts MD

## 2016-04-12 ENCOUNTER — Telehealth: Payer: Self-pay | Admitting: *Deleted

## 2016-04-12 NOTE — Telephone Encounter (Signed)
I have returned the patient's call regarding her annual appt. Appt made and patient aware.

## 2016-04-12 NOTE — Telephone Encounter (Signed)
Yes, I think ASA '81mg'$  is right for her.    Hannah Abts MD

## 2016-04-12 NOTE — Telephone Encounter (Signed)
-----   Message from Arnoldo Lenis, MD sent at 04/12/2016 10:01 AM EDT ----- Heart monitor looks ok, no signifiacnt abnormal rhythms. If still having symptoms would start lopressor '25mg'$  bid, this will help prevent the feeling of her heart racing and also help bring her bp down which was a little elevated last visit. She should call and update [place] if this dose does not relieve her symptoms as we have room to go up on dose, f/u 3 months   J BrancH MD

## 2016-04-12 NOTE — Telephone Encounter (Signed)
Pt aware - updated medication list  

## 2016-04-12 NOTE — Telephone Encounter (Signed)
Pt aware - says denies any symptoms since wearing monitor - doesn't want to start lopressor at this time - wants to know if she should be taking ASA '81mg'$  daily. 3 month f/u made

## 2016-05-04 ENCOUNTER — Other Ambulatory Visit: Payer: Self-pay | Admitting: "Endocrinology

## 2016-05-12 DIAGNOSIS — Z1231 Encounter for screening mammogram for malignant neoplasm of breast: Secondary | ICD-10-CM | POA: Diagnosis not present

## 2016-06-17 DIAGNOSIS — N3001 Acute cystitis with hematuria: Secondary | ICD-10-CM | POA: Diagnosis not present

## 2016-06-17 DIAGNOSIS — R350 Frequency of micturition: Secondary | ICD-10-CM | POA: Diagnosis not present

## 2016-06-17 DIAGNOSIS — Z6838 Body mass index (BMI) 38.0-38.9, adult: Secondary | ICD-10-CM | POA: Diagnosis not present

## 2016-06-20 ENCOUNTER — Encounter: Payer: Self-pay | Admitting: Cardiology

## 2016-06-20 ENCOUNTER — Ambulatory Visit (INDEPENDENT_AMBULATORY_CARE_PROVIDER_SITE_OTHER): Payer: Medicare Other | Admitting: Cardiology

## 2016-06-20 VITALS — BP 124/78 | HR 70 | Ht 68.0 in | Wt 258.0 lb

## 2016-06-20 DIAGNOSIS — R002 Palpitations: Secondary | ICD-10-CM | POA: Diagnosis not present

## 2016-06-20 DIAGNOSIS — I10 Essential (primary) hypertension: Secondary | ICD-10-CM

## 2016-06-20 NOTE — Patient Instructions (Signed)

## 2016-06-20 NOTE — Progress Notes (Signed)
Clinical Summary Ms. Hunkele is a 66 y.o.female seen today for follow up of the following medical problems.   1. Palpitations - symptoms started about mid January. Mainly occurs at night, rarely during the day. Feeling of heart pounding, no other associated symptoms. Last few minutes, longest up to 20 minutes. Occurs 2-4 times a week. Has some decreasing in frequency and severity - coffee occasional, green tea x 1 in the morning, no sodas, no energy drinks, no alcohol.  - recent decrease in thyroid meds in October.   - 03/2016 event monitor without arrhytmias - still with occasional palpitations. Fairly mild. She hast not wanted to try av nodal agent for symptoms  2. HTN - cough on ACE-I. Changed to ARB by pcp.  - remains compliant with meds  3. OSA - she is compliant with CPAP Past Medical History:  Diagnosis Date  . Arthritis   . Diabetes mellitus without complication ([place])   . History of endometrial cancer 01/05/2016  . Hyperlipidemia   . Hypertension   . Overactive bladder   . PMB (postmenopausal bleeding) 11/18/2014  . Thyroid disease      No Known Allergies   Current Outpatient Prescriptions  Medication Sig Dispense Refill  . amLODipine (NORVASC) 5 MG tablet Take 5 mg by mouth daily.    Marland Kitchen aspirin EC 81 MG tablet Take 81 mg by mouth daily.    Marland Kitchen levothyroxine (SYNTHROID, LEVOTHROID) 100 MCG tablet Take 1 tablet (100 mcg total) by mouth daily. 90 tablet 1  . levothyroxine (SYNTHROID, LEVOTHROID) 100 MCG tablet TAKE 1 TABLET (100 MCG TOTAL) BY MOUTH DAILY BEFORE BREAKFAST. 30 tablet 3  . metFORMIN (GLUCOPHAGE-XR) 500 MG 24 hr tablet Take 1 tablet (500 mg total) by mouth daily with breakfast. 30 tablet 6  . Multiple Vitamins-Minerals (CENTRUM SILVER PO) Take by mouth daily.     . valsartan (DIOVAN) 40 MG tablet Take 40 mg by mouth daily.     No current facility-administered medications for this visit.      Past Surgical History:  Procedure Laterality Date  .  COLONOSCOPY    . COLONOSCOPY N/A 04/09/2015   Procedure: COLONOSCOPY;  Surgeon: Rogene Houston, MD;  [facility];  Service: Endoscopy;  Laterality: N/A;  1030  . CYSTOSCOPY    . POLYPECTOMY  04/09/2015   Procedure: POLYPECTOMY;  Surgeon: Rogene Houston, MD;  [facility];  Service: Endoscopy;;  Ascending colon polyp removed via hot snare  . ROBOTIC ASSISTED TOTAL HYSTERECTOMY WITH BILATERAL SALPINGO OOPHERECTOMY Bilateral 12/18/2014   Procedure: XI ROBOTIC ASSISTED TOTAL HYSTERECTOMY WITH BILATERAL SALPINGO OOPHORECTOMY;  Surgeon: Everitt Amber, MD;  Location: [facility];  Service: Gynecology;  Laterality: Bilateral;     No Known Allergies    Family History  Problem Relation Age of Onset  . Congestive Heart Failure Mother   . Arthritis Father   . Other Father        killed in [place]  . Other Sister        brain injury after fall  . Cancer Sister        multiple mylemoma  . Obesity Daughter   . Diabetes Maternal Grandmother   . Parkinson's disease Paternal Grandfather      Social History Ms. Loa reports that she quit smoking about 11 years ago. Her smoking use included Cigarettes. She started smoking about 48 years ago. She has a 72.00 pack-year smoking history. She has never used smokeless tobacco. Ms. Clagett  reports that she does not drink alcohol.   Review of Systems CONSTITUTIONAL: No weight loss, fever, chills, weakness or fatigue.  HEENT: Eyes: No visual loss, blurred vision, double vision or yellow sclerae.No hearing loss, sneezing, congestion, runny nose or sore throat.  SKIN: No rash or itching.  CARDIOVASCULAR: per hpi RESPIRATORY: No shortness of breath, cough or sputum.  GASTROINTESTINAL: No anorexia, nausea, vomiting or diarrhea. No abdominal pain or blood.  GENITOURINARY: No burning on urination, no polyuria NEUROLOGICAL: No headache, dizziness, syncope, paralysis, ataxia, numbness or tingling in the extremities. No change in bowel or bladder  control.  MUSCULOSKELETAL: No muscle, back pain, joint pain or stiffness.  LYMPHATICS: No enlarged nodes. No history of splenectomy.  PSYCHIATRIC: No history of depression or anxiety.  ENDOCRINOLOGIC: No reports of sweating, cold or heat intolerance. No polyuria or polydipsia.  Marland Kitchen   Physical Examination Vitals:   06/20/16 1051  BP: 124/78  Pulse: 70   Vitals:   06/20/16 1051  Weight: 258 lb (117 kg)  Height: 5\' 8"  (1.727 m)    Gen: resting comfortably, no acute distress HEENT: no scleral icterus, pupils equal round and reactive, no palptable cervical adenopathy,  CV: RRR, 2/6 systolic murmur rusb, no jvd Resp: Clear to auscultation bilaterally GI: abdomen is soft, non-tender, non-distended, normal bowel sounds, no hepatosplenomegaly MSK: extremities are warm, no edema.  Skin: warm, no rash Neuro:  no focal deficits Psych: appropriate affect   Diagnostic Studies 03/2016 echo Study Conclusions  - Left ventricle: The cavity size was normal. Wall thickness was   normal. Systolic function was normal. The estimated ejection   fraction was in the range of 60% to 65%. Wall motion was normal;   there were no regional wall motion abnormalities. Doppler   parameters are consistent with abnormal left ventricular   relaxation (grade 1 diastolic dysfunction).   03/2016 Cardiac monitor  Telemetry tracings show normal sinus rhythm  No symptoms reported  No significant arrhythmias    Assessment and Plan  1. Palpitations - benign cardiac monitor - fairly mild ongoing symptoms, she has not wanted to try av nodal agents for symptoms - continue to monitor.   2. HTN - at goal, continue current meds      Arnoldo Lenis, M.D.,

## 2016-07-01 DIAGNOSIS — G4733 Obstructive sleep apnea (adult) (pediatric): Secondary | ICD-10-CM | POA: Diagnosis not present

## 2016-07-01 DIAGNOSIS — E119 Type 2 diabetes mellitus without complications: Secondary | ICD-10-CM | POA: Diagnosis not present

## 2016-07-01 DIAGNOSIS — K219 Gastro-esophageal reflux disease without esophagitis: Secondary | ICD-10-CM | POA: Diagnosis not present

## 2016-07-01 DIAGNOSIS — E7800 Pure hypercholesterolemia, unspecified: Secondary | ICD-10-CM | POA: Diagnosis not present

## 2016-07-01 DIAGNOSIS — E782 Mixed hyperlipidemia: Secondary | ICD-10-CM | POA: Diagnosis not present

## 2016-07-01 DIAGNOSIS — I10 Essential (primary) hypertension: Secondary | ICD-10-CM | POA: Diagnosis not present

## 2016-07-06 DIAGNOSIS — Z6838 Body mass index (BMI) 38.0-38.9, adult: Secondary | ICD-10-CM | POA: Diagnosis not present

## 2016-07-06 DIAGNOSIS — E039 Hypothyroidism, unspecified: Secondary | ICD-10-CM | POA: Diagnosis not present

## 2016-07-06 DIAGNOSIS — R350 Frequency of micturition: Secondary | ICD-10-CM | POA: Diagnosis not present

## 2016-07-06 DIAGNOSIS — I10 Essential (primary) hypertension: Secondary | ICD-10-CM | POA: Diagnosis not present

## 2016-07-06 DIAGNOSIS — Z6839 Body mass index (BMI) 39.0-39.9, adult: Secondary | ICD-10-CM | POA: Diagnosis not present

## 2016-07-06 DIAGNOSIS — M7061 Trochanteric bursitis, right hip: Secondary | ICD-10-CM | POA: Diagnosis not present

## 2016-07-06 DIAGNOSIS — E119 Type 2 diabetes mellitus without complications: Secondary | ICD-10-CM | POA: Diagnosis not present

## 2016-07-06 DIAGNOSIS — Z6837 Body mass index (BMI) 37.0-37.9, adult: Secondary | ICD-10-CM | POA: Diagnosis not present

## 2016-07-06 DIAGNOSIS — K219 Gastro-esophageal reflux disease without esophagitis: Secondary | ICD-10-CM | POA: Diagnosis not present

## 2016-07-06 DIAGNOSIS — M1991 Primary osteoarthritis, unspecified site: Secondary | ICD-10-CM | POA: Diagnosis not present

## 2016-07-06 DIAGNOSIS — R002 Palpitations: Secondary | ICD-10-CM | POA: Diagnosis not present

## 2016-07-06 DIAGNOSIS — R5382 Chronic fatigue, unspecified: Secondary | ICD-10-CM | POA: Diagnosis not present

## 2016-07-06 DIAGNOSIS — E7800 Pure hypercholesterolemia, unspecified: Secondary | ICD-10-CM | POA: Diagnosis not present

## 2016-07-11 ENCOUNTER — Ambulatory Visit (HOSPITAL_BASED_OUTPATIENT_CLINIC_OR_DEPARTMENT_OTHER): Payer: Medicare Other

## 2016-07-11 ENCOUNTER — Encounter: Payer: Self-pay | Admitting: Gynecologic Oncology

## 2016-07-11 ENCOUNTER — Ambulatory Visit: Payer: Medicare Other | Attending: Gynecologic Oncology | Admitting: Gynecologic Oncology

## 2016-07-11 VITALS — BP 142/64 | HR 68 | Temp 98.5°F | Resp 20 | Wt 262.4 lb

## 2016-07-11 DIAGNOSIS — Z820 Family history of epilepsy and other diseases of the nervous system: Secondary | ICD-10-CM | POA: Diagnosis not present

## 2016-07-11 DIAGNOSIS — I10 Essential (primary) hypertension: Secondary | ICD-10-CM | POA: Diagnosis not present

## 2016-07-11 DIAGNOSIS — Z8249 Family history of ischemic heart disease and other diseases of the circulatory system: Secondary | ICD-10-CM | POA: Insufficient documentation

## 2016-07-11 DIAGNOSIS — E079 Disorder of thyroid, unspecified: Secondary | ICD-10-CM | POA: Insufficient documentation

## 2016-07-11 DIAGNOSIS — Z8261 Family history of arthritis: Secondary | ICD-10-CM | POA: Insufficient documentation

## 2016-07-11 DIAGNOSIS — E785 Hyperlipidemia, unspecified: Secondary | ICD-10-CM | POA: Diagnosis not present

## 2016-07-11 DIAGNOSIS — Z807 Family history of other malignant neoplasms of lymphoid, hematopoietic and related tissues: Secondary | ICD-10-CM | POA: Insufficient documentation

## 2016-07-11 DIAGNOSIS — C561 Malignant neoplasm of right ovary: Secondary | ICD-10-CM

## 2016-07-11 DIAGNOSIS — E119 Type 2 diabetes mellitus without complications: Secondary | ICD-10-CM | POA: Insufficient documentation

## 2016-07-11 DIAGNOSIS — C541 Malignant neoplasm of endometrium: Secondary | ICD-10-CM

## 2016-07-11 DIAGNOSIS — Z7984 Long term (current) use of oral hypoglycemic drugs: Secondary | ICD-10-CM | POA: Insufficient documentation

## 2016-07-11 DIAGNOSIS — Z833 Family history of diabetes mellitus: Secondary | ICD-10-CM | POA: Insufficient documentation

## 2016-07-11 DIAGNOSIS — N3281 Overactive bladder: Secondary | ICD-10-CM | POA: Insufficient documentation

## 2016-07-11 DIAGNOSIS — Z87891 Personal history of nicotine dependence: Secondary | ICD-10-CM | POA: Diagnosis not present

## 2016-07-11 DIAGNOSIS — Z7982 Long term (current) use of aspirin: Secondary | ICD-10-CM | POA: Diagnosis not present

## 2016-07-11 DIAGNOSIS — Z9889 Other specified postprocedural states: Secondary | ICD-10-CM | POA: Diagnosis not present

## 2016-07-11 DIAGNOSIS — Z90710 Acquired absence of both cervix and uterus: Secondary | ICD-10-CM | POA: Insufficient documentation

## 2016-07-11 DIAGNOSIS — Z8542 Personal history of malignant neoplasm of other parts of uterus: Secondary | ICD-10-CM | POA: Insufficient documentation

## 2016-07-11 DIAGNOSIS — Z90722 Acquired absence of ovaries, bilateral: Secondary | ICD-10-CM | POA: Insufficient documentation

## 2016-07-11 NOTE — Progress Notes (Signed)
FOLLOW UP NOTE  Chief Complaint:  Chief Complaint  Patient presents with  . Endometrial Cancer  . LMP ovary    Assessment:    66 y.o. year old with Stage IC right serous LMP and stage IA grade1 endometrioid endometrial cancer.   S/p robotic hysterectomy, BSO on 12/18/14.  She had low risk features in her tumor and therefore no adjuvant therapy was recommended. Preoperative CA 125 was elevated.   No evidence for recurrence on today's exam  Plan: CA 125 today  Discussed signs and symptoms of recurrence including vaginal bleeding or discharge, leg pain or swelling and changes in bowel or bladder habits.  Return to clinic in 6 months to see Derrek Monaco and in 12 months to see me  HPI:  Hannah Barrett is a very pleasant 66 year old G1P1 who is seen in consultation at the request of Derrek Monaco (NP) for a complex right ovarian cyst. The patient reports having some bloody and mucoid discharge for approximately 1 year. She ignored it after her father died in 03/31/14, but allerted her physicians to this in October, 2016. At that time they performed an [place] of thepelvis on 11/24/14 which showed an 8.7cm uterus with 69mm endometrial stripe and a 10cm right ovarian cyst with thin septations. The left ovary was normal.  The CT of the abdomen and pelvis on 11/27/14 featured a 9.2cm complex right ovarian cyst with mural nodularity. There was no ascites or peritoneal nodularity or adenopathy.  CA 125 on 11/25/14 was elevated at 238.4 U/mL.  She then underwent a robotic hysterectomy, BSO, on 47/0/96 without complications.  Her postoperative course was uncomplicated.  Her final pathologic diagnosis is a Stage IA Grade 1 endometrioid endometrial cancer with no lymphovascular space invasion, no myometrial invasion, this was incidentally found. The right ovary contained a serous low malignant potential tumor with no surface involvement. Washings were positive.  Interval Hx: She is seen today for a  routine surveillance. She is doing well. She has no complaints including no bleeding or pelvic pain. She has intermittent constipation.  CA 125 on 07/13/15 was normal at 5.  Past Medical History:  Diagnosis Date  . Arthritis   . Diabetes mellitus without complication ([place])   . History of endometrial cancer 01/05/2016  . Hyperlipidemia   . Hypertension   . Overactive bladder   . PMB (postmenopausal bleeding) 11/18/2014  . Thyroid disease    Past Surgical History:  Procedure Laterality Date  . COLONOSCOPY    . COLONOSCOPY N/A 04/09/2015   Procedure: COLONOSCOPY;  Surgeon: Rogene Houston, MD;  [facility];  Service: Endoscopy;  Laterality: N/A;  1030  . CYSTOSCOPY    . POLYPECTOMY  04/09/2015   Procedure: POLYPECTOMY;  Surgeon: Rogene Houston, MD;  [facility];  Service: Endoscopy;;  Ascending colon polyp removed via hot snare  . ROBOTIC ASSISTED TOTAL HYSTERECTOMY WITH BILATERAL SALPINGO OOPHERECTOMY Bilateral 12/18/2014   Procedure: XI ROBOTIC ASSISTED TOTAL HYSTERECTOMY WITH BILATERAL SALPINGO OOPHORECTOMY;  Surgeon: Everitt Amber, MD;  [facility];  Service: Gynecology;  Laterality: Bilateral;   Family History  Problem Relation Age of Onset  . Congestive Heart Failure Mother   . Arthritis Father   . Other Father        killed in [place]  . Other Sister        brain injury after fall  . Cancer Sister        multiple mylemoma  . Obesity Daughter   .  Diabetes Maternal Grandmother   . Parkinson's disease Paternal Grandfather    Social History   Social History  . Marital status: Married    Spouse name: N/A  . Number of children: N/A  . Years of education: N/A   Occupational History  . Not on file.   Social History Main Topics  . Smoking status: Former Smoker    Packs/day: 2.00    Years: 36.00    Types: Cigarettes    Start date: 03/03/1968    Quit date: 10/27/2004  . Smokeless tobacco: Never Used  . Alcohol use No  . Drug use: No  . Sexual  activity: Yes    Birth control/ protection: Post-menopausal, Surgical     Comment: hyst   Other Topics Concern  . Not on file   Social History Narrative  . No narrative on file   Current Outpatient Prescriptions on File Prior to Visit  Medication Sig Dispense Refill  . amLODipine (NORVASC) 5 MG tablet Take 5 mg by mouth daily.    Marland Kitchen aspirin EC 81 MG tablet Take 81 mg by mouth daily.    Marland Kitchen levothyroxine (SYNTHROID, LEVOTHROID) 100 MCG tablet TAKE 1 TABLET (100 MCG TOTAL) BY MOUTH DAILY BEFORE BREAKFAST. 30 tablet 3  . metFORMIN (GLUCOPHAGE-XR) 500 MG 24 hr tablet Take 1 tablet (500 mg total) by mouth daily with breakfast. 30 tablet 6  . Multiple Vitamins-Minerals (CENTRUM SILVER PO) Take by mouth daily.     . valsartan (DIOVAN) 40 MG tablet Take 40 mg by mouth daily.     No current facility-administered medications on file prior to visit.    No Known Allergies   Review of systems: Constitutional:  She has no weight gain or weight loss. She has no fever or chills. Eyes: No blurred vision Ears, Nose, Mouth, Throat: No dizziness, headaches or changes in hearing. No mouth sores. Cardiovascular: No chest pain, palpitations or edema. Respiratory:  No shortness of breath, wheezing or cough Gastrointestinal: She has normal bowel movements without diarrhea or constipation. She denies any nausea or vomiting. She denies blood in her stool or heart burn. Genitourinary:  She denies pelvic pain, pelvic pressure or changes in her urinary function. She has no hematuria, dysuria, or incontinence. No vaginal bleeding and vaginal discharge Musculoskeletal: Denies muscle weakness or joint pains.  Skin:  She has no skin changes, rashes or itching Neurological:  Denies dizziness or headaches. No neuropathy, no numbness or tingling. Psychiatric:  She denies depression or anxiety. Hematologic/Lymphatic:   No easy bruising or bleeding   Physical Exam: Blood pressure (!) 142/64, pulse 68, temperature 98.5  F (36.9 C), temperature source Oral, resp. rate 20, weight 262 lb 6.4 oz (119 kg), SpO2 97 %. General: Well dressed, well nourished in no apparent distress.   HEENT:  Normocephalic and atraumatic, no lesions.  Extraocular muscles intact. Sclerae anicteric. Pupils equal, round, reactive. No mouth sores or ulcers. Thyroid is normal size, not nodular, midline. Abdomen:  Soft, nontender, nondistended.  No palpable masses.  No hepatosplenomegaly.  No ascites. Normal bowel sounds.  No hernias.  Incisions are well healed. Genitourinary: Normal EGBUS  Vaginal cuff intact.  No bleeding or discharge.  Limited exam due to long vagina and narrow upper vagina. No cul de sac fullness. Extremities: No cyanosis, clubbing or edema.  No calf tenderness or erythema. No palpable cords. Psychiatric: Mood and affect are appropriate. Neurological: Awake, alert and oriented x 3. Sensation is intact, no neuropathy.  Musculoskeletal: No pain, normal strength  and range of motion.   Donaciano Eva, MD

## 2016-07-11 NOTE — Patient Instructions (Signed)
Follow up with Dr. Laurann Montana in 6 months and Dr. Denman George in 1 year.  Call after visit with Dr. Laurann Montana to schedule appointment with Dr. Denman George.

## 2016-07-12 LAB — CA 125: Cancer Antigen (CA) 125: 6.2 U/mL (ref 0.0–38.1)

## 2016-07-14 ENCOUNTER — Telehealth: Payer: Self-pay

## 2016-07-14 NOTE — Telephone Encounter (Signed)
Told Ms Hatcher that CA-125 level was stable and normal at 6.2 per Joylene John, NP.

## 2016-07-19 ENCOUNTER — Ambulatory Visit (INDEPENDENT_AMBULATORY_CARE_PROVIDER_SITE_OTHER): Payer: Medicare Other | Admitting: Urology

## 2016-07-19 DIAGNOSIS — N3281 Overactive bladder: Secondary | ICD-10-CM | POA: Diagnosis not present

## 2016-07-19 DIAGNOSIS — N3000 Acute cystitis without hematuria: Secondary | ICD-10-CM

## 2016-09-05 ENCOUNTER — Other Ambulatory Visit: Payer: Self-pay | Admitting: "Endocrinology

## 2016-09-06 ENCOUNTER — Telehealth: Payer: Self-pay | Admitting: "Endocrinology

## 2016-09-06 NOTE — Telephone Encounter (Signed)
Hannah Barrett is asking for a refill on levothyroxine (SYNTHROID, LEVOTHROID) 100 MCG tablet  Please advise?

## 2016-09-06 NOTE — Telephone Encounter (Signed)
Rx sent 

## 2016-09-21 DIAGNOSIS — E038 Other specified hypothyroidism: Secondary | ICD-10-CM | POA: Diagnosis not present

## 2016-09-21 DIAGNOSIS — E118 Type 2 diabetes mellitus with unspecified complications: Secondary | ICD-10-CM | POA: Diagnosis not present

## 2016-09-21 LAB — TSH: TSH: 3.75 (ref <=5.90)

## 2016-09-21 LAB — BASIC METABOLIC PANEL
BUN: 15 (ref 4–21)
CREATININE: 0.6 (ref <=1.1)

## 2016-09-21 LAB — HEMOGLOBIN A1C: Hemoglobin A1C: 6.3

## 2016-09-28 ENCOUNTER — Ambulatory Visit (INDEPENDENT_AMBULATORY_CARE_PROVIDER_SITE_OTHER): Payer: Medicare Other | Admitting: "Endocrinology

## 2016-09-28 ENCOUNTER — Encounter: Payer: Self-pay | Admitting: "Endocrinology

## 2016-09-28 VITALS — BP 108/71 | HR 85 | Ht 68.0 in | Wt 263.0 lb

## 2016-09-28 DIAGNOSIS — E038 Other specified hypothyroidism: Secondary | ICD-10-CM | POA: Diagnosis not present

## 2016-09-28 DIAGNOSIS — I10 Essential (primary) hypertension: Secondary | ICD-10-CM | POA: Diagnosis not present

## 2016-09-28 DIAGNOSIS — E118 Type 2 diabetes mellitus with unspecified complications: Secondary | ICD-10-CM | POA: Diagnosis not present

## 2016-09-28 LAB — HM DIABETES FOOT EXAM: HM Diabetic Foot Exam: NORMAL

## 2016-09-28 LAB — HM DIABETES EYE EXAM

## 2016-09-28 MED ORDER — LEVOTHYROXINE SODIUM 100 MCG PO TABS: 100.0000 ug | ORAL_TABLET | Freq: Every day | ORAL | 6 refills | Status: DC

## 2016-09-28 MED ORDER — METFORMIN HCL ER 500 MG PO TB24: 500.0000 mg | ORAL_TABLET | Freq: Every day | ORAL | 6 refills | Status: DC

## 2016-09-28 NOTE — Progress Notes (Signed)
Subjective:    Patient ID: Hannah Barrett, female    DOB: 08/10/1950, PCP Sasser, Silvestre Moment, MD   Past Medical History:  Diagnosis Date  . Arthritis   . Diabetes mellitus without complication ([place])   . History of endometrial cancer 01/05/2016  . Hyperlipidemia   . Hypertension   . Overactive bladder   . PMB (postmenopausal bleeding) 11/18/2014  . Thyroid disease    Past Surgical History:  Procedure Laterality Date  . COLONOSCOPY    . COLONOSCOPY N/A 04/09/2015   Procedure: COLONOSCOPY;  Surgeon: Rogene Houston, MD;  [facility];  Service: Endoscopy;  Laterality: N/A;  1030  . CYSTOSCOPY    . POLYPECTOMY  04/09/2015   Procedure: POLYPECTOMY;  Surgeon: Rogene Houston, MD;  [facility];  Service: Endoscopy;;  Ascending colon polyp removed via hot snare  . ROBOTIC ASSISTED TOTAL HYSTERECTOMY WITH BILATERAL SALPINGO OOPHERECTOMY Bilateral 12/18/2014   Procedure: XI ROBOTIC ASSISTED TOTAL HYSTERECTOMY WITH BILATERAL SALPINGO OOPHORECTOMY;  Surgeon: Everitt Amber, MD;  [facility];  Service: Gynecology;  Laterality: Bilateral;   Social History   Social History  . Marital status: Married    Spouse name: N/A  . Number of children: N/A  . Years of education: N/A   Social History Main Topics  . Smoking status: Former Smoker    Packs/day: 2.00    Years: 36.00    Types: Cigarettes    Start date: 03/03/1968    Quit date: 10/27/2004  . Smokeless tobacco: Never Used  . Alcohol use No  . Drug use: No  . Sexual activity: Yes    Birth control/ protection: Post-menopausal, Surgical     Comment: hyst   Other Topics Concern  . None   Social History Narrative  . None   Outpatient Encounter Prescriptions as of 09/28/2016  Medication Sig  . losartan (COZAAR) 25 MG tablet Take 25 mg by mouth daily.  Marland Kitchen amLODipine (NORVASC) 5 MG tablet Take 5 mg by mouth daily.  Marland Kitchen aspirin EC 81 MG tablet Take 81 mg by mouth daily.  Marland Kitchen levothyroxine (SYNTHROID, LEVOTHROID) 100 MCG  tablet Take 1 tablet (100 mcg total) by mouth daily before breakfast.  . metFORMIN (GLUCOPHAGE-XR) 500 MG 24 hr tablet Take 1 tablet (500 mg total) by mouth daily with breakfast.  . Multiple Vitamins-Minerals (CENTRUM SILVER PO) Take by mouth daily.   . [DISCONTINUED] levothyroxine (SYNTHROID, LEVOTHROID) 100 MCG tablet TAKE 1 TABLET (100 MCG TOTAL) BY MOUTH DAILY BEFORE BREAKFAST.  . [DISCONTINUED] metFORMIN (GLUCOPHAGE-XR) 500 MG 24 hr tablet Take 1 tablet (500 mg total) by mouth daily with breakfast.  . [DISCONTINUED] valsartan (DIOVAN) 40 MG tablet Take 40 mg by mouth daily.   No facility-administered encounter medications on file as of 09/28/2016.    ALLERGIES: No Known Allergies VACCINATION STATUS:  There is no immunization history on file for this patient.  Hypertension   Diabetes     66 yr old with hx of RAI induced hypothyroidism, and controlled type 2 DM , Metformin extended release 500 minute grams by mouth daily. She is here for follow-up for hypothyroidism,  controlled type 2 diabetes and hypertension.  - She is on levothyroxine 100 g by mouth every morning. She is compliant. Her last a1c remains controlled with A1c of 6.3%  from 6.7%.  she is tolerating MTF.  She denies heat intolerance, palpitations, and tremors. She has lost approximately 8 pounds since last visit.  Review of Systems  Constitutional:  +  steady weight,  no subjective hyperthermia/hypothermia Eyes: no blurry vision, no xerophthalmia ENT: no sore throat, no nodules palpated in throat, no dysphagia/odynophagia, no hoarseness Cardiovascular: no CP/SOB/palpitations/leg swelling Respiratory: no cough/SOB Gastrointestinal: no N/V/D/C Musculoskeletal: no muscle/joint aches Skin: no rashes Neurological: no tremors/numbness/tingling/dizziness Psychiatric: no depression/anxiety  Objective:    BP 108/71   Pulse 85   Ht 5\' 8"  (1.727 m)   Wt 263 lb (119.3 kg)   BMI 39.99 kg/m   Wt Readings from Last 3  Encounters:  09/28/16 263 lb (119.3 kg)  07/11/16 262 lb 6.4 oz (119 kg)  06/20/16 258 lb (117 kg)    Physical Exam  Constitutional: obese, in NAD Eyes: PERRLA, EOMI, no exophthalmos ENT: moist mucous membranes, no thyromegaly, no cervical lymphadenopathy Cardiovascular: RRR, No MRG Respiratory: CTA B Gastrointestinal: abdomen soft, NT, ND, BS+ Musculoskeletal: no deformities, strength intact in all 4 Skin: moist, warm, no rashes Neurological: no tremor with outstretched hands, DTR normal in all 4  Results for orders placed or performed in visit on 69/48/54  Basic metabolic panel  Result Value Ref Range   BUN 15 4 - 21   Creatinine 0.6 0.5 - 1.1  Hemoglobin A1c  Result Value Ref Range   Hemoglobin A1C 6.3   TSH  Result Value Ref Range   TSH 3.75 0.41 - 5.90  HM DIABETES EYE EXAM  Result Value Ref Range   HM Diabetic Eye Exam No Retinopathy No Retinopathy  HM DIABETES FOOT EXAM  Result Value Ref Range   HM Diabetic Foot Exam normal    Complete Blood Count (Most recent): Lab Results  Component Value Date   WBC 12.6 (H) 12/19/2014   HGB 11.1 (L) 12/19/2014   HCT 34.8 (L) 12/19/2014   MCV 93.5 12/19/2014   PLT 260 12/19/2014   Chemistry (most recent): Lab Results  Component Value Date   NA 136 12/19/2014   K 3.9 12/19/2014   CL 103 12/19/2014   CO2 29 12/19/2014   BUN 15 09/21/2016   CREATININE 0.6 09/21/2016   Diabetic Labs (most recent): Lab Results  Component Value Date   HGBA1C 6.3 09/21/2016   HGBA1C 6.7 02/19/2015   HGBA1C 6.1 (A) 12/02/2014      Assessment & Plan:   1. hypothyroidism:  -Her TFTs are Consistent with appropriate replacement . I will continue her levothyroxine to 100 g by mouth every morning.    - We discussed about correct intake of levothyroxine, at fasting, with water, separated by at least 30 minutes from breakfast, and separated by more than 4 hours from calcium, iron, multivitamins, acid reflux medications (PPIs). -Patient  is made aware of the fact that thyroid hormone replacement is needed for life, dose to be adjusted by periodic monitoring of thyroid function tests.  2. Type 2 diabetes mellitus with complication - Her recent A1c is 6.3%. - I will continue her metformin at  500 mg extended release once a day.   she is following wth Jearld Fenton, CDE for DM education. She previously declined an offer of Qsymia for weight control. she has no contraindications to its use   3. Essential hypertension, benign - Uncontrolled. Continue amlodipine 5 mg by mouth daily along with lisinopril 10 mg by mouth daily.  4. Osteoporosis:  Since last visit, she has decided to discontinue Alendronate because a family member has had side effects from it. She states that her primary care doctor's office DEXA scan was "normal".  - I advised patient to maintain close  follow up with her PCP for primary care needs. Follow up plan: Return in about 6 months (around 03/28/2017) for follow up with pre-visit labs.  Glade Lloyd, MD Phone: [phone]  Fax: [phone]  This note was partially dictated with voice recognition software. Similar sounding words can be transcribed inadequately or may not  be corrected upon review.  09/28/2016, 3:01 PM

## 2016-09-28 NOTE — Patient Instructions (Signed)

## 2016-11-01 DIAGNOSIS — Z23 Encounter for immunization: Secondary | ICD-10-CM | POA: Diagnosis not present

## 2016-11-07 DIAGNOSIS — I10 Essential (primary) hypertension: Secondary | ICD-10-CM | POA: Diagnosis not present

## 2016-11-07 DIAGNOSIS — E782 Mixed hyperlipidemia: Secondary | ICD-10-CM | POA: Diagnosis not present

## 2016-11-07 DIAGNOSIS — K219 Gastro-esophageal reflux disease without esophagitis: Secondary | ICD-10-CM | POA: Diagnosis not present

## 2016-11-07 DIAGNOSIS — G4733 Obstructive sleep apnea (adult) (pediatric): Secondary | ICD-10-CM | POA: Diagnosis not present

## 2016-11-07 DIAGNOSIS — E119 Type 2 diabetes mellitus without complications: Secondary | ICD-10-CM | POA: Diagnosis not present

## 2016-11-07 DIAGNOSIS — E7800 Pure hypercholesterolemia, unspecified: Secondary | ICD-10-CM | POA: Diagnosis not present

## 2016-11-11 DIAGNOSIS — R5382 Chronic fatigue, unspecified: Secondary | ICD-10-CM | POA: Diagnosis not present

## 2016-11-11 DIAGNOSIS — R350 Frequency of micturition: Secondary | ICD-10-CM | POA: Diagnosis not present

## 2016-11-11 DIAGNOSIS — Z6837 Body mass index (BMI) 37.0-37.9, adult: Secondary | ICD-10-CM | POA: Diagnosis not present

## 2016-11-11 DIAGNOSIS — R002 Palpitations: Secondary | ICD-10-CM | POA: Diagnosis not present

## 2016-11-11 DIAGNOSIS — K219 Gastro-esophageal reflux disease without esophagitis: Secondary | ICD-10-CM | POA: Diagnosis not present

## 2016-11-11 DIAGNOSIS — Z6839 Body mass index (BMI) 39.0-39.9, adult: Secondary | ICD-10-CM | POA: Diagnosis not present

## 2016-11-11 DIAGNOSIS — Z6838 Body mass index (BMI) 38.0-38.9, adult: Secondary | ICD-10-CM | POA: Diagnosis not present

## 2016-11-11 DIAGNOSIS — M7061 Trochanteric bursitis, right hip: Secondary | ICD-10-CM | POA: Diagnosis not present

## 2016-11-11 DIAGNOSIS — I10 Essential (primary) hypertension: Secondary | ICD-10-CM | POA: Diagnosis not present

## 2016-11-11 DIAGNOSIS — E119 Type 2 diabetes mellitus without complications: Secondary | ICD-10-CM | POA: Diagnosis not present

## 2016-11-11 DIAGNOSIS — M1991 Primary osteoarthritis, unspecified site: Secondary | ICD-10-CM | POA: Diagnosis not present

## 2016-11-11 DIAGNOSIS — E7800 Pure hypercholesterolemia, unspecified: Secondary | ICD-10-CM | POA: Diagnosis not present

## 2016-11-11 DIAGNOSIS — E039 Hypothyroidism, unspecified: Secondary | ICD-10-CM | POA: Diagnosis not present

## 2016-12-28 ENCOUNTER — Other Ambulatory Visit: Payer: Medicare Other | Admitting: Adult Health

## 2017-01-31 ENCOUNTER — Ambulatory Visit (INDEPENDENT_AMBULATORY_CARE_PROVIDER_SITE_OTHER): Payer: Medicare Other | Admitting: Adult Health

## 2017-01-31 ENCOUNTER — Encounter: Payer: Self-pay | Admitting: Adult Health

## 2017-01-31 ENCOUNTER — Other Ambulatory Visit (HOSPITAL_COMMUNITY)
Admission: RE | Admit: 2017-01-31 | Discharge: 2017-01-31 | Disposition: A | Discharge status: Home / Self Care | Payer: Medicare Other | Source: Ambulatory Visit | Attending: Adult Health | Admitting: Adult Health

## 2017-01-31 VITALS — BP 134/70 | HR 76 | Resp 16 | Ht 68.0 in | Wt 269.0 lb

## 2017-01-31 DIAGNOSIS — Z8542 Personal history of malignant neoplasm of other parts of uterus: Secondary | ICD-10-CM | POA: Insufficient documentation

## 2017-01-31 DIAGNOSIS — Z08 Encounter for follow-up examination after completed treatment for malignant neoplasm: Secondary | ICD-10-CM | POA: Diagnosis not present

## 2017-01-31 DIAGNOSIS — Z1211 Encounter for screening for malignant neoplasm of colon: Secondary | ICD-10-CM | POA: Diagnosis not present

## 2017-01-31 DIAGNOSIS — Z90710 Acquired absence of both cervix and uterus: Secondary | ICD-10-CM | POA: Diagnosis not present

## 2017-01-31 DIAGNOSIS — Z01419 Encounter for gynecological examination (general) (routine) without abnormal findings: Secondary | ICD-10-CM

## 2017-01-31 DIAGNOSIS — Z1212 Encounter for screening for malignant neoplasm of rectum: Secondary | ICD-10-CM

## 2017-01-31 LAB — HEMOCCULT GUIAC POC 1CARD (OFFICE): FECAL OCCULT BLD: NEGATIVE
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
## 2017-02-02 LAB — CYTOLOGY - PAP
DIAGNOSIS: NEGATIVE
HPV (WINDOPATH): NOT DETECTED

## 2017-03-13 DIAGNOSIS — E119 Type 2 diabetes mellitus without complications: Secondary | ICD-10-CM | POA: Diagnosis not present

## 2017-03-13 DIAGNOSIS — I10 Essential (primary) hypertension: Secondary | ICD-10-CM | POA: Diagnosis not present

## 2017-03-13 DIAGNOSIS — G4733 Obstructive sleep apnea (adult) (pediatric): Secondary | ICD-10-CM | POA: Diagnosis not present

## 2017-03-13 DIAGNOSIS — K219 Gastro-esophageal reflux disease without esophagitis: Secondary | ICD-10-CM | POA: Diagnosis not present

## 2017-03-13 DIAGNOSIS — E7800 Pure hypercholesterolemia, unspecified: Secondary | ICD-10-CM | POA: Diagnosis not present

## 2017-03-13 DIAGNOSIS — E782 Mixed hyperlipidemia: Secondary | ICD-10-CM | POA: Diagnosis not present

## 2017-03-13 LAB — LIPID PANEL
Cholesterol: 175 (ref 0–200)
HDL: 44 (ref 35–70)
LDL CALC: 109
Triglycerides: 108 (ref 40–160)

## 2017-03-13 LAB — BASIC METABOLIC PANEL
BUN: 16 (ref 4–21)
Creatinine: 0.6 (ref <=1.1)

## 2017-03-13 LAB — TSH: TSH: 3.7 (ref <=5.90)

## 2017-03-13 LAB — HEMOGLOBIN A1C: HEMOGLOBIN A1C: 6.2

## 2017-03-17 DIAGNOSIS — M1991 Primary osteoarthritis, unspecified site: Secondary | ICD-10-CM | POA: Diagnosis not present

## 2017-03-17 DIAGNOSIS — Z0000 Encounter for general adult medical examination without abnormal findings: Secondary | ICD-10-CM | POA: Diagnosis not present

## 2017-03-17 DIAGNOSIS — R350 Frequency of micturition: Secondary | ICD-10-CM | POA: Diagnosis not present

## 2017-03-17 DIAGNOSIS — R5382 Chronic fatigue, unspecified: Secondary | ICD-10-CM | POA: Diagnosis not present

## 2017-03-17 DIAGNOSIS — I10 Essential (primary) hypertension: Secondary | ICD-10-CM | POA: Diagnosis not present

## 2017-03-17 DIAGNOSIS — Z6841 Body Mass Index (BMI) 40.0 and over, adult: Secondary | ICD-10-CM | POA: Diagnosis not present

## 2017-03-17 DIAGNOSIS — E039 Hypothyroidism, unspecified: Secondary | ICD-10-CM | POA: Diagnosis not present

## 2017-03-17 DIAGNOSIS — Z1212 Encounter for screening for malignant neoplasm of rectum: Secondary | ICD-10-CM | POA: Diagnosis not present

## 2017-03-17 DIAGNOSIS — K219 Gastro-esophageal reflux disease without esophagitis: Secondary | ICD-10-CM | POA: Diagnosis not present

## 2017-03-17 DIAGNOSIS — R002 Palpitations: Secondary | ICD-10-CM | POA: Diagnosis not present

## 2017-03-17 DIAGNOSIS — E782 Mixed hyperlipidemia: Secondary | ICD-10-CM | POA: Diagnosis not present

## 2017-03-21 DIAGNOSIS — E118 Type 2 diabetes mellitus with unspecified complications: Secondary | ICD-10-CM | POA: Diagnosis not present

## 2017-03-21 DIAGNOSIS — E038 Other specified hypothyroidism: Secondary | ICD-10-CM | POA: Diagnosis not present

## 2017-03-21 LAB — TSH: TSH: 3.7 (ref 0.41–5.90)

## 2017-03-28 ENCOUNTER — Encounter: Payer: Self-pay | Admitting: "Endocrinology

## 2017-03-28 ENCOUNTER — Ambulatory Visit (INDEPENDENT_AMBULATORY_CARE_PROVIDER_SITE_OTHER): Payer: Medicare Other | Admitting: "Endocrinology

## 2017-03-28 VITALS — BP 134/82 | HR 80 | Ht 68.0 in | Wt 270.0 lb

## 2017-03-28 DIAGNOSIS — E038 Other specified hypothyroidism: Secondary | ICD-10-CM

## 2017-03-28 DIAGNOSIS — E118 Type 2 diabetes mellitus with unspecified complications: Secondary | ICD-10-CM | POA: Diagnosis not present

## 2017-03-28 DIAGNOSIS — I10 Essential (primary) hypertension: Secondary | ICD-10-CM

## 2017-03-28 NOTE — Progress Notes (Signed)
Subjective:    Patient ID: Hannah Barrett, female    DOB: 10/16/1950, PCP Sasser, Silvestre Moment, MD   Past Medical History:  Diagnosis Date  . Arthritis   . Diabetes mellitus without complication ([place])   . History of endometrial cancer 01/05/2016  . Hyperlipidemia   . Hypertension   . Overactive bladder   . PMB (postmenopausal bleeding) 11/18/2014  . Thyroid disease    Past Surgical History:  Procedure Laterality Date  . COLONOSCOPY    . COLONOSCOPY N/A 04/09/2015   Procedure: COLONOSCOPY;  Surgeon: Rogene Houston, MD;  [facility];  Service: Endoscopy;  Laterality: N/A;  1030  . CYSTOSCOPY    . POLYPECTOMY  04/09/2015   Procedure: POLYPECTOMY;  Surgeon: Rogene Houston, MD;  [facility];  Service: Endoscopy;;  Ascending colon polyp removed via hot snare  . ROBOTIC ASSISTED TOTAL HYSTERECTOMY WITH BILATERAL SALPINGO OOPHERECTOMY Bilateral 12/18/2014   Procedure: XI ROBOTIC ASSISTED TOTAL HYSTERECTOMY WITH BILATERAL SALPINGO OOPHORECTOMY;  Surgeon: Everitt Amber, MD;  Location: [facility];  Service: Gynecology;  Laterality: Bilateral;   Social History   Socioeconomic History  . Marital status: Married    Spouse name: None  . Number of children: None  . Years of education: None  . Highest education level: None  Social Needs  . Financial resource strain: None  . Food insecurity - worry: None  . Food insecurity - inability: None  . Transportation needs - medical: None  . Transportation needs - non-medical: None  Occupational History  . None  Tobacco Use  . Smoking status: Former Smoker    Packs/day: 2.00    Years: 36.00    Pack years: 72.00    Types: Cigarettes    Start date: 03/03/1968    Last attempt to quit: 10/27/2004    Years since quitting: 12.4  . Smokeless tobacco: Never Used  Substance and Sexual Activity  . Alcohol use: No  . Drug use: No  . Sexual activity: Not Currently    Birth control/protection: Post-menopausal, Surgical    Comment: hyst   Other Topics Concern  . None  Social History Narrative  . None   Outpatient Encounter Medications as of 03/28/2017  Medication Sig  . amLODipine (NORVASC) 5 MG tablet Take 5 mg by mouth daily.  Marland Kitchen aspirin EC 81 MG tablet Take 81 mg by mouth daily.  Marland Kitchen levothyroxine (SYNTHROID, LEVOTHROID) 100 MCG tablet Take 1 tablet (100 mcg total) by mouth daily before breakfast.  . losartan (COZAAR) 50 MG tablet Take 50 mg by mouth daily.   . metFORMIN (GLUCOPHAGE-XR) 500 MG 24 hr tablet Take 1 tablet (500 mg total) by mouth daily with breakfast.  . Multiple Vitamins-Minerals (CENTRUM SILVER PO) Take by mouth daily.    No facility-administered encounter medications on file as of 03/28/2017.    ALLERGIES: No Known Allergies VACCINATION STATUS:  There is no immunization history on file for this patient.  Hypertension   Diabetes     67 yr old with hx of RAI induced hypothyroidism, and controlled type 2 DM , Metformin extended release 500 mg by mouth daily. She is here for follow-up for hypothyroidism,  controlled type 2 diabetes and hypertension.  - She is on levothyroxine 100 g by mouth every morning. She is compliant. Her A1c remains controlled between 6.1% and 6.7%.  She is tolerating her metformin.    She denies heat intolerance, palpitations, and tremors.  -She has gained 6 pounds since last visit.  Review of Systems  Constitutional:  + steady weight gain,  no subjective hyperthermia/hypothermia Eyes: no blurry vision, no xerophthalmia ENT: no sore throat, no nodules palpated in throat, no dysphagia/odynophagia, no hoarseness Cardiovascular: no chest pain, no palpitations, no leg swelling.   Respiratory: no cough/SOB Gastrointestinal: no N/V/D/C Musculoskeletal: no muscle/joint aches Skin: no rashes Neurological: No tremors, no numbness, no tingling.  Psychiatric: no depression/anxiety  Objective:    BP 134/82   Pulse 80   Ht 5\' 8"  (1.727 m)   Wt 270 lb (122.5 kg)   BMI 41.05  kg/m   Wt Readings from Last 3 Encounters:  03/28/17 270 lb (122.5 kg)  01/31/17 269 lb (122 kg)  09/28/16 263 lb (119.3 kg)    Physical Exam  Constitutional: obese, not in acute distress.   Eyes: PERRLA, EOMI, no exophthalmos ENT: moist mucous membranes, no thyromegaly, no cervical lymphadenopathy  Musculoskeletal: no deformities, strength intact in all 4 extremities.   Skin: moist, warm, no rashes Neurological: no tremor with outstretched hands   Results for orders placed or performed in visit on 64/33/29  Basic metabolic panel  Result Value Ref Range   BUN 16 4 - 21   Creatinine 0.6 0.5 - 1.1  Lipid panel  Result Value Ref Range   Triglycerides 108 40 - 160   Cholesterol 175 0 - 200   HDL 44 35 - 70   LDL Cholesterol 109   Hemoglobin A1c  Result Value Ref Range   Hemoglobin A1C 6.2   TSH  Result Value Ref Range   TSH 3.70 0.41 - 5.90  TSH  Result Value Ref Range   TSH 3.70 0.41 - 5.90   Complete Blood Count (Most recent): Lab Results  Component Value Date   WBC 12.6 (H) 12/19/2014   HGB 11.1 (L) 12/19/2014   HCT 34.8 (L) 12/19/2014   MCV 93.5 12/19/2014   PLT 260 12/19/2014   Chemistry (most recent): Lab Results  Component Value Date   NA 136 12/19/2014   K 3.9 12/19/2014   CL 103 12/19/2014   CO2 29 12/19/2014   BUN 16 03/13/2017   CREATININE 0.6 03/13/2017   Diabetic Labs (most recent): Lab Results  Component Value Date   HGBA1C 6.2 03/13/2017   HGBA1C 6.3 09/21/2016   HGBA1C 6.7 02/19/2015      Assessment & Plan:   1. RAI  hypothyroidism:  -Her thyroid function tests are consistent with appropriate replacement.  I discussed and continued her levothyroxine at 100 mcg p.o. nightly.    - We discussed about correct intake of levothyroxine, at fasting, with water, separated by at least 30 minutes from breakfast, and separated by more than 4 hours from calcium, iron, multivitamins, acid reflux medications (PPIs). -Patient is made aware of the  fact that thyroid hormone replacement is needed for life, dose to be adjusted by periodic monitoring of thyroid function tests.  2. Type 2 diabetes mellitus with complication - Her recent A1c is 6.2%. - I will continue her metformin at  500 mg extended release once a day.   she is following wth Jearld Fenton, CDE for DM education. She continues to gain weight.  She previously declined an offer of Qsymia for weight control. she has no contraindications to its use.   3. Essential hypertension -Her blood pressure is controlled to target.  She is advised to continue amlodipine 5 mg p.o. nightly and lisinopril 10 mg by mouth daily.  4. Osteoporosis:  Since last visit, she  has decided to discontinue Alendronate because a family member has had side effects from it. She states that her primary care doctor's office DEXA scan was "normal".  - I advised patient to maintain close follow up with her PCP for primary care needs.  Follow up plan: Return in about 6 months (around 09/28/2017) for follow up with pre-visit labs.  Glade Lloyd, MD Phone: [phone]  Fax: [phone]  This note was partially dictated with voice recognition software. Similar sounding words can be transcribed inadequately or may not  be corrected upon review.  03/28/2017, 8:44 PM

## 2017-04-30 ENCOUNTER — Other Ambulatory Visit: Payer: Self-pay | Admitting: "Endocrinology

## 2017-05-15 DIAGNOSIS — Z1231 Encounter for screening mammogram for malignant neoplasm of breast: Secondary | ICD-10-CM | POA: Diagnosis not present

## 2017-07-14 ENCOUNTER — Inpatient Hospital Stay: Payer: Medicare Other

## 2017-07-14 ENCOUNTER — Inpatient Hospital Stay: Payer: Medicare Other | Attending: Gynecologic Oncology | Admitting: Gynecologic Oncology

## 2017-07-14 ENCOUNTER — Encounter: Payer: Self-pay | Admitting: Gynecologic Oncology

## 2017-07-14 VITALS — BP 167/54 | HR 75 | Temp 98.5°F | Resp 20 | Ht 68.0 in | Wt 276.1 lb

## 2017-07-14 DIAGNOSIS — D3911 Neoplasm of uncertain behavior of right ovary: Secondary | ICD-10-CM

## 2017-07-14 DIAGNOSIS — Z90722 Acquired absence of ovaries, bilateral: Secondary | ICD-10-CM | POA: Diagnosis not present

## 2017-07-14 DIAGNOSIS — Z87891 Personal history of nicotine dependence: Secondary | ICD-10-CM | POA: Diagnosis not present

## 2017-07-14 DIAGNOSIS — Z90710 Acquired absence of both cervix and uterus: Secondary | ICD-10-CM

## 2017-07-14 DIAGNOSIS — C541 Malignant neoplasm of endometrium: Secondary | ICD-10-CM

## 2017-07-14 NOTE — Patient Instructions (Addendum)
Please notify Dr Denman George at phone number [phone] if you notice vaginal bleeding, new pelvic or abdominal pains, bloating, feeling full easy, or a change in bladder or bowel function.   Please return to see Criss Alvine in 6 months for a pelvic exam (pap is not necessary), and then contact Dr Serita Grit office in 2020 to schedule a July, 2020 appointment with Dr Denman George (see number above). She will check a CA 125 lab at that visit.   Dr Hector Shade is a very good joint replacement surgeon in [place] recommended by Dr Denman George.

## 2017-07-14 NOTE — Progress Notes (Signed)
FOLLOW UP NOTE  Chief Complaint:  Chief Complaint  Patient presents with  . Endometrial cancer ([place])    follow-up  . LMP right ovary    Assessment:    67 y.o. year old with Stage IC right serous LMP and stage IA grade1 endometrioid endometrial cancer.   S/p robotic hysterectomy, BSO on 12/18/14.  She had low risk features in her tumor and therefore no adjuvant therapy was recommended. Preoperative CA 125 was elevated.   No evidence for recurrence on today's exam  Plan: CA 125 today  Discussed signs and symptoms of recurrence including vaginal bleeding or discharge, leg pain or swelling and changes in bowel or bladder habits.  Return to clinic in 6 months to see Derrek Monaco and in 12 months to see me. Pap is not necessary as part of endometrial cancer surveillance and she no longer meets dysplasia surveillance criteria.   HPI:  Hannah Barrett is a very pleasant 67 year old G1P1 who is seen in consultation at the request of Derrek Monaco (NP) for a complex right ovarian cyst. The patient reports having some bloody and mucoid discharge for approximately 1 year. She ignored it after her father died in 16-Apr-2014, but allerted her physicians to this in October, 2016. At that time they performed an [place] of thepelvis on 11/24/14 which showed an 8.7cm uterus with 82mm endometrial stripe and a 10cm right ovarian cyst with thin septations. The left ovary was normal.  The CT of the abdomen and pelvis on 11/27/14 featured a 9.2cm complex right ovarian cyst with mural nodularity. There was no ascites or peritoneal nodularity or adenopathy.  CA 125 on 11/25/14 was elevated at 238.4 U/mL.  She then underwent a robotic hysterectomy, BSO, on 31/4/97 without complications.  Her postoperative course was uncomplicated.  Her final pathologic diagnosis is a Stage IA Grade 1 endometrioid endometrial cancer with no lymphovascular space invasion, no myometrial invasion, this was incidentally found. The right  ovary contained a serous low malignant potential tumor with no surface involvement. Washings were positive.  Interval Hx: She is seen today for a routine surveillance. She is doing well. She has no complaints including no bleeding or pelvic pain. She has intermittent constipation.  CA 125 on 07/13/15 was normal at 5. CA 125 on 07/11/16 was normal at 6.2 Pap with provider Derrek Monaco was normal with negative hr HPV in January, 2019.   Past Medical History:  Diagnosis Date  . Arthritis   . Diabetes mellitus without complication ([place])   . History of endometrial cancer 01/05/2016  . Hyperlipidemia   . Hypertension   . Overactive bladder   . PMB (postmenopausal bleeding) 11/18/2014  . Thyroid disease    Past Surgical History:  Procedure Laterality Date  . COLONOSCOPY    . COLONOSCOPY N/A 04/09/2015   Procedure: COLONOSCOPY;  Surgeon: Rogene Houston, MD;  [facility];  Service: Endoscopy;  Laterality: N/A;  1030  . CYSTOSCOPY    . POLYPECTOMY  04/09/2015   Procedure: POLYPECTOMY;  Surgeon: Rogene Houston, MD;  [facility];  Service: Endoscopy;;  Ascending colon polyp removed via hot snare  . ROBOTIC ASSISTED TOTAL HYSTERECTOMY WITH BILATERAL SALPINGO OOPHERECTOMY Bilateral 12/18/2014   Procedure: XI ROBOTIC ASSISTED TOTAL HYSTERECTOMY WITH BILATERAL SALPINGO OOPHORECTOMY;  Surgeon: Everitt Amber, MD;  Location[facility];  Service: Gynecology;  Laterality: Bilateral;   Family History  Problem Relation Age of Onset  . Congestive Heart Failure Mother   . Arthritis  Father   . Other Father        killed in [place]  . Other Sister        brain injury after fall  . Cancer Sister        multiple mylemoma  . Obesity Daughter   . Diabetes Maternal Grandmother   . Parkinson's disease Paternal Grandfather    Social History   Socioeconomic History  . Marital status: Married    Spouse name: Not on file  . Number of children: Not on file  . Years of education: Not on  file  . Highest education level: Not on file  Occupational History  . Not on file  Social Needs  . Financial resource strain: Not on file  . Food insecurity:    Worry: Not on file    Inability: Not on file  . Transportation needs:    Medical: Not on file    Non-medical: Not on file  Tobacco Use  . Smoking status: Former Smoker    Packs/day: 2.00    Years: 36.00    Pack years: 72.00    Types: Cigarettes    Start date: 03/03/1968    Last attempt to quit: 10/27/2004    Years since quitting: 12.7  . Smokeless tobacco: Never Used  Substance and Sexual Activity  . Alcohol use: No  . Drug use: No  . Sexual activity: Not Currently    Birth control/protection: Post-menopausal, Surgical    Comment: hyst  Lifestyle  . Physical activity:    Days per week: Not on file    Minutes per session: Not on file  . Stress: Not on file  Relationships  . Social connections:    Talks on phone: Not on file    Gets together: Not on file    Attends religious service: Not on file    Active member of club or organization: Not on file    Attends meetings of clubs or organizations: Not on file    Relationship status: Not on file  . Intimate partner violence:    Fear of current or ex partner: Not on file    Emotionally abused: Not on file    Physically abused: Not on file    Forced sexual activity: Not on file  Other Topics Concern  . Not on file  Social History Narrative  . Not on file   Current Outpatient Medications on File Prior to Visit  Medication Sig Dispense Refill  . acetaminophen (TYLENOL) 650 MG CR tablet Take 650 mg by mouth every 8 (eight) hours as needed for pain. Tylenol Arthritis two  tablets in the morning and two tablets in the evening    . amLODipine (NORVASC) 5 MG tablet Take 5 mg by mouth daily.    Marland Kitchen levothyroxine (SYNTHROID, LEVOTHROID) 100 MCG tablet TAKE 1 TABLET (100 MCG TOTAL) BY MOUTH DAILY BEFORE BREAKFAST. 30 tablet 3  . losartan (COZAAR) 50 MG tablet Take 50 mg by  mouth daily.     . metFORMIN (GLUCOPHAGE-XR) 500 MG 24 hr tablet Take 1 tablet (500 mg total) by mouth daily with breakfast. 30 tablet 6  . Multiple Vitamins-Minerals (CENTRUM SILVER PO) Take by mouth daily.      No current facility-administered medications on file prior to visit.    No Known Allergies   Review of systems: Constitutional:  She has no weight gain or weight loss. She has no fever or chills. Eyes: No blurred vision Ears, Nose, Mouth, Throat: No dizziness, headaches or  changes in hearing. No mouth sores. Cardiovascular: No chest pain, palpitations or edema. Respiratory:  No shortness of breath, wheezing or cough Gastrointestinal: She has normal bowel movements without diarrhea or constipation. She denies any nausea or vomiting. She denies blood in her stool or heart burn. Genitourinary:  She denies pelvic pain, pelvic pressure or changes in her urinary function. She has no hematuria, dysuria, or incontinence. No vaginal bleeding and vaginal discharge Musculoskeletal: + generalized arthritis pain Skin:  She has no skin changes, rashes or itching Neurological:  Denies dizziness or headaches. No neuropathy, no numbness or tingling. Psychiatric:  She denies depression or anxiety. Hematologic/Lymphatic:   No easy bruising or bleeding   Physical Exam: Blood pressure (!) 167/54, pulse 75, temperature 98.5 F (36.9 C), resp. rate 20, height 5\' 8"  (1.727 m), weight 276 lb 1.6 oz (125.2 kg), SpO2 96 %. General: Well dressed, well nourished in no apparent distress.   HEENT:  Normocephalic and atraumatic, no lesions.  Extraocular muscles intact. Sclerae anicteric. Pupils equal, round, reactive. No mouth sores or ulcers. Thyroid is normal size, not nodular, midline. Abdomen:  Soft, nontender, nondistended.  No palpable masses.  No hepatosplenomegaly.  No ascites. Normal bowel sounds.  No hernias.  Incisions are well healed. Genitourinary: Normal EGBUS  Vaginal cuff intact.  No bleeding  or discharge.  Limited exam due to long vagina and narrow upper vagina. No cul de sac fullness. Extremities: No cyanosis, clubbing or edema.  No calf tenderness or erythema. No palpable cords. Psychiatric: Mood and affect are appropriate. Neurological: Awake, alert and oriented x 3. Sensation is intact, no neuropathy.  Musculoskeletal: No pain, normal strength and range of motion.   Thereasa Solo, MD

## 2017-07-15 LAB — CA 125: CANCER ANTIGEN (CA) 125: 5.6 U/mL (ref 0.0–38.1)

## 2017-07-17 ENCOUNTER — Telehealth: Payer: Self-pay

## 2017-07-17 DIAGNOSIS — I10 Essential (primary) hypertension: Secondary | ICD-10-CM | POA: Diagnosis not present

## 2017-07-17 DIAGNOSIS — E119 Type 2 diabetes mellitus without complications: Secondary | ICD-10-CM | POA: Diagnosis not present

## 2017-07-17 DIAGNOSIS — E7800 Pure hypercholesterolemia, unspecified: Secondary | ICD-10-CM | POA: Diagnosis not present

## 2017-07-17 DIAGNOSIS — E782 Mixed hyperlipidemia: Secondary | ICD-10-CM | POA: Diagnosis not present

## 2017-07-17 DIAGNOSIS — K219 Gastro-esophageal reflux disease without esophagitis: Secondary | ICD-10-CM | POA: Diagnosis not present

## 2017-07-17 DIAGNOSIS — E039 Hypothyroidism, unspecified: Secondary | ICD-10-CM | POA: Diagnosis not present

## 2017-07-17 DIAGNOSIS — R5382 Chronic fatigue, unspecified: Secondary | ICD-10-CM | POA: Diagnosis not present

## 2017-07-17 NOTE — Telephone Encounter (Signed)
Told Hannah Barrett that the CA-125 was WNL per Joylene John, NP at 5.6. Keep appointments for follow up with Dr. Denman George as scheduled.

## 2017-07-21 DIAGNOSIS — M1712 Unilateral primary osteoarthritis, left knee: Secondary | ICD-10-CM | POA: Diagnosis not present

## 2017-07-21 DIAGNOSIS — Z6841 Body Mass Index (BMI) 40.0 and over, adult: Secondary | ICD-10-CM | POA: Diagnosis not present

## 2017-07-21 DIAGNOSIS — M1711 Unilateral primary osteoarthritis, right knee: Secondary | ICD-10-CM | POA: Diagnosis not present

## 2017-07-21 DIAGNOSIS — R350 Frequency of micturition: Secondary | ICD-10-CM | POA: Diagnosis not present

## 2017-07-21 DIAGNOSIS — R002 Palpitations: Secondary | ICD-10-CM | POA: Diagnosis not present

## 2017-07-21 DIAGNOSIS — I10 Essential (primary) hypertension: Secondary | ICD-10-CM | POA: Diagnosis not present

## 2017-07-21 DIAGNOSIS — E782 Mixed hyperlipidemia: Secondary | ICD-10-CM | POA: Diagnosis not present

## 2017-07-21 DIAGNOSIS — E039 Hypothyroidism, unspecified: Secondary | ICD-10-CM | POA: Diagnosis not present

## 2017-07-31 ENCOUNTER — Encounter: Payer: Self-pay | Admitting: Cardiology

## 2017-07-31 ENCOUNTER — Ambulatory Visit (INDEPENDENT_AMBULATORY_CARE_PROVIDER_SITE_OTHER): Payer: Medicare Other | Admitting: Cardiology

## 2017-07-31 ENCOUNTER — Other Ambulatory Visit: Payer: Self-pay | Admitting: *Deleted

## 2017-07-31 VITALS — BP 150/68 | HR 75 | Ht 68.0 in | Wt 276.0 lb

## 2017-07-31 DIAGNOSIS — I10 Essential (primary) hypertension: Secondary | ICD-10-CM

## 2017-07-31 DIAGNOSIS — R002 Palpitations: Secondary | ICD-10-CM

## 2017-07-31 DIAGNOSIS — E119 Type 2 diabetes mellitus without complications: Secondary | ICD-10-CM | POA: Diagnosis not present

## 2017-07-31 MED ORDER — LOSARTAN POTASSIUM 100 MG PO TABS: 100.0000 mg | ORAL_TABLET | Freq: Every day | ORAL | 2 refills | Status: DC

## 2017-07-31 MED ORDER — ATORVASTATIN CALCIUM 40 MG PO TABS: 40.0000 mg | ORAL_TABLET | Freq: Every day | ORAL | 2 refills | Status: DC

## 2017-07-31 NOTE — Patient Instructions (Addendum)
Medication Instructions:   Your physician has recommended you make the following change in your medication:   Start lipitor 40 mg by mouth daily.  Increase losartan to 100 mg by mouth daily.  Continue all other medications the same.  Labwork:  Your physician recommends that you return for lab work in: 2 weeks to check your BMET.  Testing/Procedures:  NONE  Follow-Up:  Your physician recommends that you schedule a follow-up appointment in: 1 year. You will receive a reminder letter in the mail in about 10 months reminding you to call and schedule your appointment. If you don't receive this letter, please contact our office.  Any Other Special Instructions Will Be Listed Below (If Applicable).  Please use the information given to you today on the DASH diet as a guide.   If you need a refill on your cardiac medications before your next appointment, please call your pharmacy.

## 2017-07-31 NOTE — Progress Notes (Signed)
Clinical Summary Ms. Gomm is a 67 y.o.female seen today for follow up of the following medical problems.   1. Palpitations - 03/2016 event monitor without arrhytmias - some palpitations at times, overall mild.    2. HTN - cough on ACE-I. Changed to ARB by pcp.  - remains compliant with meds - checks at home regularly. Home bp's 130s/60s - pcp recently increased norvacs to 10mg  about 1 week ago.    3. OSA - she is compliant with CPAP       Past Medical History:  Diagnosis Date  . Arthritis   . Diabetes mellitus without complication ([place])   . History of endometrial cancer 01/05/2016  . Hyperlipidemia   . Hypertension   . Overactive bladder   . PMB (postmenopausal bleeding) 11/18/2014  . Thyroid disease      No Known Allergies   Current Outpatient Medications  Medication Sig Dispense Refill  . acetaminophen (TYLENOL) 650 MG CR tablet Take 650 mg by mouth every 8 (eight) hours as needed for pain. Tylenol Arthritis two  tablets in the morning and two tablets in the evening    . amLODipine (NORVASC) 5 MG tablet Take 5 mg by mouth daily.    Marland Kitchen levothyroxine (SYNTHROID, LEVOTHROID) 100 MCG tablet TAKE 1 TABLET (100 MCG TOTAL) BY MOUTH DAILY BEFORE BREAKFAST. 30 tablet 3  . losartan (COZAAR) 50 MG tablet Take 50 mg by mouth daily.     . metFORMIN (GLUCOPHAGE-XR) 500 MG 24 hr tablet Take 1 tablet (500 mg total) by mouth daily with breakfast. 30 tablet 6  . Multiple Vitamins-Minerals (CENTRUM SILVER PO) Take by mouth daily.      No current facility-administered medications for this visit.      Past Surgical History:  Procedure Laterality Date  . COLONOSCOPY    . COLONOSCOPY N/A 04/09/2015   Procedure: COLONOSCOPY;  Surgeon: Rogene Houston, MD;  [facility];  Service: Endoscopy;  Laterality: N/A;  1030  . CYSTOSCOPY    . POLYPECTOMY  04/09/2015   Procedure: POLYPECTOMY;  Surgeon: Rogene Houston, MD;  [facility];  Service: Endoscopy;;   Ascending colon polyp removed via hot snare  . ROBOTIC ASSISTED TOTAL HYSTERECTOMY WITH BILATERAL SALPINGO OOPHERECTOMY Bilateral 12/18/2014   Procedure: XI ROBOTIC ASSISTED TOTAL HYSTERECTOMY WITH BILATERAL SALPINGO OOPHORECTOMY;  Surgeon: Everitt Amber, MD;  [facility];  Service: Gynecology;  Laterality: Bilateral;     No Known Allergies    Family History  Problem Relation Age of Onset  . Congestive Heart Failure Mother   . Arthritis Father   . Other Father        killed in [place]  . Other Sister        brain injury after fall  . Cancer Sister        multiple mylemoma  . Obesity Daughter   . Diabetes Maternal Grandmother   . Parkinson's disease Paternal Grandfather      Social History Ms. Genson reports that she quit smoking about 12 years ago. Her smoking use included cigarettes. She started smoking about 49 years ago. She has a 72.00 pack-year smoking history. She has never used smokeless tobacco. Ms. Fray reports that she does not drink alcohol.   Review of Systems CONSTITUTIONAL: No weight loss, fever, chills, weakness or fatigue.  HEENT: Eyes: No visual loss, blurred vision, double vision or yellow sclerae.No hearing loss, sneezing, congestion, runny nose or sore throat.  SKIN: No rash or itching.  CARDIOVASCULAR: per hpi RESPIRATORY: No shortness of breath, cough or sputum.  GASTROINTESTINAL: No anorexia, nausea, vomiting or diarrhea. No abdominal pain or blood.  GENITOURINARY: No burning on urination, no polyuria NEUROLOGICAL: No headache, dizziness, syncope, paralysis, ataxia, numbness or tingling in the extremities. No change in bowel or bladder control.  MUSCULOSKELETAL: No muscle, back pain, joint pain or stiffness.  LYMPHATICS: No enlarged nodes. No history of splenectomy.  PSYCHIATRIC: No history of depression or anxiety.  ENDOCRINOLOGIC: No reports of sweating, cold or heat intolerance. No polyuria or polydipsia.  Marland Kitchen   Physical Examination Vitals:    07/31/17 1601  BP: (!) 150/68  Pulse: 75  SpO2: 97%   Vitals:   07/31/17 1601  Weight: 276 lb (125.2 kg)  Height: 5\' 8"  (1.727 m)    Gen: resting comfortably, no acute distress HEENT: no scleral icterus, pupils equal round and reactive, no palptable cervical adenopathy,  CV: RRR, no m/r/g, no jvd Resp: Clear to auscultation bilaterally GI: abdomen is soft, non-tender, non-distended, normal bowel sounds, no hepatosplenomegaly MSK: extremities are warm, no edema.  Skin: warm, no rash Neuro:  no focal deficits Psych: appropriate affect   Diagnostic Studies 03/2016 echo Study Conclusions  - Left ventricle: The cavity size was normal. Wall thickness was normal. Systolic function was normal. The estimated ejection fraction was in the range of 60% to 65%. Wall motion was normal; there were no regional wall motion abnormalities. Doppler parameters are consistent with abnormal left ventricular relaxation (grade 1 diastolic dysfunction).   03/2016 Cardiac monitor  Telemetry tracings show normal sinus rhythm  No symptoms reported  No significant arrhythmias      Assessment and Plan  1. Palpitations - benign cardiac monitor -mild symptoms at times, continue to monitor.  EKG today in clinic shows normal sinus rhythm  2. HTN - above goal, increase losartan to 100mg  dialy, recheck BMET in 2 weeks  3. Hyperlipidemia - in setting of DM2 she should be on at least moderate strength statin, we will start atorvastaitn 40mg  daily        Arnoldo Lenis, M.D.

## 2017-08-02 ENCOUNTER — Ambulatory Visit (INDEPENDENT_AMBULATORY_CARE_PROVIDER_SITE_OTHER): Payer: Medicare Other | Admitting: Physician Assistant

## 2017-08-02 ENCOUNTER — Encounter (INDEPENDENT_AMBULATORY_CARE_PROVIDER_SITE_OTHER): Payer: Self-pay | Admitting: Physician Assistant

## 2017-08-02 DIAGNOSIS — M170 Bilateral primary osteoarthritis of knee: Secondary | ICD-10-CM

## 2017-08-02 NOTE — Progress Notes (Signed)
Office Visit Note   Patient: Hannah Barrett           Date of Birth: 1950/01/20           MRN: [id number] Visit Date: 08/02/2017              Requested by: Sasser, Silvestre Moment, MD [place], [place] [address] PCP: Manon Hilding, MD   Assessment & Plan: Visit Diagnoses:  1. Primary osteoarthritis of both knees     Plan: Discussed quad strengthening exercises and knee friendly exercises with patient.  She is been told to stay away from NSAIDs as much as possible therefore I recommended natural anti-inflammatory such as to [facility] and dried cherries.  Offered cortisone injection in the most problematic knee the right today she defers.  She did like to try the natural anti-inflammatories and exercise on her own and see how she does.  Therefore we will have her follow-up with [place] on as-needed basis pain persist or becomes worse.  Follow-Up Instructions: Return if symptoms worsen or fail to improve.   Orders:  No orders of the defined types were placed in this encounter.  No orders of the defined types were placed in this encounter.     Procedures: No procedures performed   Clinical Data: No additional findings.   Subjective: No chief complaint on file.   HPI Hannah Barrett is a 67 year old female was seen for bilateral knee pain.  Patient has had knee pain for the past few months that is getting worse.  She notes she has had some on and off knee pain for years.  She is taken Tylenol arthritis 2 times a day.  She denies any mechanical symptoms of either knee.  Right knee is more painful than the left.  She is tried no bracing.  She said no treatments for this.  She is diabetic and has been told to stay away from NSAIDs due to possible kidney injury.  Radiographs are reviewed from CDs that the patient brought in with her that were taken on 07/21/2017.  Right knee 4 view shows moderate medial joint line narrowing mild lateral compartment changes and moderate patellofemoral changes.  No  acute fracture.  Knee is well located.  Left knee no acute fracture and he is well located.  Moderate patellofemoral changes moderate narrowing medial compartment.  Lateral compartment is well-preserved.  Review of Systems No fevers chills shortness of breath.  Objective: Vital Signs: There were no vitals taken for this visit.  Physical Exam  Constitutional: She is oriented to person, place, and time. She appears well-developed and well-nourished. No distress.  Pulmonary/Chest: Effort normal.  Neurological: She is alert and oriented to person, place, and time.  Skin: She is not diaphoretic.  Psychiatric: She has a normal mood and affect.    Ortho Exam Bilateral knees she has tenderness along medial joint line of both knees.  No abnormal warmth erythema or effusion.  No instability valgus varus stressing of either knee.  Good range of motion of both knees.  Patellofemoral crepitus bilaterally. Specialty Comments:  No specialty comments available.  Imaging: No results found.   PMFS History: Patient Active Problem List   Diagnosis Date Noted  . Morbid obesity ([place]) 09/28/2016  . Osteoporosis 03/23/2016  . History of endometrial cancer 01/05/2016  . Serous tumor, of low malignant potential ([place]) 07/13/2015  . Pelvic mass in female 12/18/2014  . Essential hypertension, benign 12/08/2014  . Morbid obesity with BMI of  40.0-44.9, adult ([place]) 12/01/2014  . Diabetes mellitus ([place]) 12/01/2014  . Obstructive sleep apnea 12/01/2014  . Hypothyroidism 12/01/2014  . Ovarian mass, right 12/01/2014  . PMB (postmenopausal bleeding) 11/18/2014  . GOITER, MULTINODULAR 01/24/2007  . HYPOTHYROIDISM, POST-RADIATION 01/24/2007  . HTN (hypertension) 09/16/2006   Past Medical History:  Diagnosis Date  . Arthritis   . Diabetes mellitus without complication ([place])   . History of endometrial cancer 01/05/2016  . Hyperlipidemia   . Hypertension   . Overactive bladder   . PMB (postmenopausal  bleeding) 11/18/2014  . Thyroid disease     Family History  Problem Relation Age of Onset  . Congestive Heart Failure Mother   . Arthritis Father   . Other Father        killed in [place]  . Other Sister        brain injury after fall  . Cancer Sister        multiple mylemoma  . Obesity Daughter   . Diabetes Maternal Grandmother   . Parkinson's disease Paternal Grandfather     Past Surgical History:  Procedure Laterality Date  . COLONOSCOPY    . COLONOSCOPY N/A 04/09/2015   Procedure: COLONOSCOPY;  Surgeon: Rogene Houston, MD;  [facility];  Service: Endoscopy;  Laterality: N/A;  1030  . CYSTOSCOPY    . POLYPECTOMY  04/09/2015   Procedure: POLYPECTOMY;  Surgeon: Rogene Houston, MD;  Location: [facility];  Service: Endoscopy;;  Ascending colon polyp removed via hot snare  . ROBOTIC ASSISTED TOTAL HYSTERECTOMY WITH BILATERAL SALPINGO OOPHERECTOMY Bilateral 12/18/2014   Procedure: XI ROBOTIC ASSISTED TOTAL HYSTERECTOMY WITH BILATERAL SALPINGO OOPHORECTOMY;  Surgeon: Everitt Amber, MD;  Location: [facility];  Service: Gynecology;  Laterality: Bilateral;   Social History   Occupational History  . Not on file  Tobacco Use  . Smoking status: Former Smoker    Packs/day: 2.00    Years: 36.00    Pack years: 72.00    Types: Cigarettes    Start date: 03/03/1968    Last attempt to quit: 10/27/2004    Years since quitting: 12.7  . Smokeless tobacco: Never Used  Substance and Sexual Activity  . Alcohol use: No  . Drug use: No  . Sexual activity: Not Currently    Birth control/protection: Post-menopausal, Surgical    Comment: hyst

## 2017-08-13 ENCOUNTER — Encounter: Payer: Self-pay | Admitting: Cardiology

## 2017-08-14 ENCOUNTER — Other Ambulatory Visit (HOSPITAL_COMMUNITY)
Admission: RE | Admit: 2017-08-14 | Discharge: 2017-08-14 | Disposition: A | Discharge status: Home / Self Care | Payer: Medicare Other | Source: Ambulatory Visit | Attending: Cardiology | Admitting: Cardiology

## 2017-08-14 DIAGNOSIS — I10 Essential (primary) hypertension: Secondary | ICD-10-CM | POA: Diagnosis not present

## 2017-08-14 LAB — BASIC METABOLIC PANEL
Anion gap: 8 (ref 5–15)
BUN: 21 mg/dL (ref 8–23)
CO2: 27 mmol/L (ref 22–32)
Calcium: 9 mg/dL (ref 8.9–10.3)
Chloride: 107 mmol/L (ref 98–111)
Creatinine, Ser: 0.64 mg/dL (ref 0.44–1.00)
GFR calc Af Amer: >60 mL/min (ref >60)
GFR calc non Af Amer: >60 mL/min (ref >60)
Glucose, Bld: 121 mg/dL — ABNORMAL HIGH (ref 70–99)
Potassium: 4 mmol/L (ref 3.5–5.1)
SODIUM: 142 mmol/L (ref 135–145)

## 2017-08-21 ENCOUNTER — Other Ambulatory Visit: Payer: Self-pay | Admitting: "Endocrinology

## 2017-09-04 ENCOUNTER — Other Ambulatory Visit: Payer: Self-pay | Admitting: "Endocrinology

## 2017-09-20 ENCOUNTER — Encounter: Payer: Self-pay | Admitting: "Endocrinology

## 2017-09-20 DIAGNOSIS — E038 Other specified hypothyroidism: Secondary | ICD-10-CM | POA: Diagnosis not present

## 2017-09-20 DIAGNOSIS — E118 Type 2 diabetes mellitus with unspecified complications: Secondary | ICD-10-CM | POA: Diagnosis not present

## 2017-09-20 LAB — BASIC METABOLIC PANEL
BUN: 20 (ref 4–21)
Creatinine: 0.6 (ref 0.5–1.1)

## 2017-09-20 LAB — TSH: TSH: 9.01 — AB (ref 0.41–5.90)

## 2017-09-20 LAB — HEMOGLOBIN A1C: Hemoglobin A1C: 6.5

## 2017-09-28 ENCOUNTER — Ambulatory Visit (INDEPENDENT_AMBULATORY_CARE_PROVIDER_SITE_OTHER): Payer: Medicare Other | Admitting: "Endocrinology

## 2017-09-28 ENCOUNTER — Encounter: Payer: Self-pay | Admitting: "Endocrinology

## 2017-09-28 VITALS — BP 148/81 | HR 73 | Ht 68.0 in | Wt 278.0 lb

## 2017-09-28 DIAGNOSIS — E118 Type 2 diabetes mellitus with unspecified complications: Secondary | ICD-10-CM

## 2017-09-28 DIAGNOSIS — E038 Other specified hypothyroidism: Secondary | ICD-10-CM | POA: Diagnosis not present

## 2017-09-28 DIAGNOSIS — E782 Mixed hyperlipidemia: Secondary | ICD-10-CM | POA: Insufficient documentation

## 2017-09-28 DIAGNOSIS — I10 Essential (primary) hypertension: Secondary | ICD-10-CM

## 2017-09-28 MED ORDER — LEVOTHYROXINE SODIUM 125 MCG PO TABS: 125.0000 ug | ORAL_TABLET | Freq: Every day | ORAL | 1 refills | Status: DC

## 2017-09-28 NOTE — Patient Instructions (Signed)

## 2017-09-28 NOTE — Progress Notes (Signed)
Subjective:    Patient ID: Hannah Barrett, female    DOB: [birth year], PCP Sasser, Silvestre Moment, MD   Past Medical History:  Diagnosis Date  . Arthritis   . Diabetes mellitus without complication ([place])   . History of endometrial cancer 01/05/2016  . Hyperlipidemia   . Hypertension   . Overactive bladder   . PMB (postmenopausal bleeding) 11/18/2014  . Thyroid disease    Past Surgical History:  Procedure Laterality Date  . COLONOSCOPY    . COLONOSCOPY N/A 04/09/2015   Procedure: COLONOSCOPY;  Surgeon: Rogene Houston, MD;  [facility];  Service: Endoscopy;  Laterality: N/A;  1030  . CYSTOSCOPY    . POLYPECTOMY  04/09/2015   Procedure: POLYPECTOMY;  Surgeon: Rogene Houston, MD;  [facility];  Service: Endoscopy;;  Ascending colon polyp removed via hot snare  . ROBOTIC ASSISTED TOTAL HYSTERECTOMY WITH BILATERAL SALPINGO OOPHERECTOMY Bilateral 12/18/2014   Procedure: XI ROBOTIC ASSISTED TOTAL HYSTERECTOMY WITH BILATERAL SALPINGO OOPHORECTOMY;  Surgeon: Everitt Amber, MD;  Location: [facility];  Service: Gynecology;  Laterality: Bilateral;   Social History   Socioeconomic History  . Marital status: Married    Spouse name: Not on file  . Number of children: Not on file  . Years of education: Not on file  . Highest education level: Not on file  Occupational History  . Not on file  Social Needs  . Financial resource strain: Not on file  . Food insecurity:    Worry: Not on file    Inability: Not on file  . Transportation needs:    Medical: Not on file    Non-medical: Not on file  Tobacco Use  . Smoking status: Former Smoker    Packs/day: 2.00    Years: 36.00    Pack years: 72.00    Types: Cigarettes    Start date: 03/03/1968    Last attempt to quit: 10/27/2004    Years since quitting: 12.9  . Smokeless tobacco: Never Used  Substance and Sexual Activity  . Alcohol use: No  . Drug use: No  . Sexual activity: Not Currently    Birth control/protection:  Post-menopausal, Surgical    Comment: hyst  Lifestyle  . Physical activity:    Days per week: Not on file    Minutes per session: Not on file  . Stress: Not on file  Relationships  . Social connections:    Talks on phone: Not on file    Gets together: Not on file    Attends religious service: Not on file    Active member of club or organization: Not on file    Attends meetings of clubs or organizations: Not on file    Relationship status: Not on file  Other Topics Concern  . Not on file  Social History Narrative  . Not on file   Outpatient Encounter Medications as of 09/28/2017  Medication Sig  . acetaminophen (TYLENOL) 650 MG CR tablet Take 650 mg by mouth every 8 (eight) hours as needed for pain. Tylenol Arthritis two  tablets in the morning and two tablets in the evening  . amLODipine (NORVASC) 10 MG tablet Take 10 mg by mouth daily.  Marland Kitchen atorvastatin (LIPITOR) 40 MG tablet Take 1 tablet (40 mg total) by mouth daily.  Marland Kitchen levothyroxine (SYNTHROID, LEVOTHROID) 125 MCG tablet Take 1 tablet (125 mcg total) by mouth daily before breakfast.  . losartan (COZAAR) 100 MG tablet Take 1 tablet (100 mg total) by mouth  daily.  . metFORMIN (GLUCOPHAGE-XR) 500 MG 24 hr tablet TAKE 1 TABLET BY MOUTH EVERY DAY WITH BREAKFAST  . Misc Natural Products (TART CHERRY ADVANCED) CAPS Take by mouth daily.  . Multiple Vitamins-Minerals (CENTRUM SILVER PO) Take by mouth daily.   . [DISCONTINUED] levothyroxine (SYNTHROID, LEVOTHROID) 100 MCG tablet TAKE 1 TABLET (100 MCG TOTAL) BY MOUTH DAILY BEFORE BREAKFAST.   No facility-administered encounter medications on file as of 09/28/2017.    ALLERGIES: No Known Allergies VACCINATION STATUS:  There is no immunization history on file for this patient.  Hypertension   Diabetes     67 yr old with hx of RAI induced hypothyroidism, and controlled type 2 DM .  She is here for follow-up for hypothyroidism,  controlled type 2 diabetes and hypertension.  - She is  on levothyroxine 100 g by mouth every morning. She is compliant. Her A1c remains controlled at 6.2%.  She is tolerating her metformin 500 mg ER p.o. daily after breakfast.   She denies heat intolerance, palpitations, and tremors.  -She has no new complaints today.  Review of Systems  Constitutional:  + steady weight gain,  no subjective hyperthermia/hypothermia Eyes: no blurry vision, no xerophthalmia ENT: no sore throat, no nodules palpated in throat, no dysphagia/odynophagia, no hoarseness Cardiovascular: no chest pain, no palpitations, no leg swelling.   aches Skin: no rashes Neurological: No tremors, no numbness, no tingling.  Psychiatric: no depression/anxiety  Objective:    BP (!) 148/81   Pulse 73   Wt 278 lb (126.1 kg)   BMI 42.27 kg/m   Wt Readings from Last 3 Encounters:  09/28/17 278 lb (126.1 kg)  07/31/17 276 lb (125.2 kg)  07/14/17 276 lb 1.6 oz (125.2 kg)    Physical Exam  Constitutional: obese, not in acute distress.   Eyes: PERRLA, EOMI, no exophthalmos ENT: moist mucous membranes, no thyromegaly, no cervical lymphadenopathy  Musculoskeletal: no deformities, strength intact in all 4 extremities.   Skin: moist, warm, no rashes Neurological: no tremor with outstretched hands   Results for orders placed or performed in visit on 78/24/23  Basic metabolic panel  Result Value Ref Range   BUN 20 4 - 21   Creatinine 0.6 0.5 - 1.1  Hemoglobin A1c  Result Value Ref Range   Hemoglobin A1C 6.5   TSH  Result Value Ref Range   TSH 9.01 (A) 0.41 - 5.90   Complete Blood Count (Most recent): Lab Results  Component Value Date   WBC 12.6 (H) 12/19/2014   HGB 11.1 (L) 12/19/2014   HCT 34.8 (L) 12/19/2014   MCV 93.5 12/19/2014   PLT 260 12/19/2014   Chemistry (most recent): Lab Results  Component Value Date   NA 142 08/14/2017   K 4.0 08/14/2017   CL 107 08/14/2017   CO2 27 08/14/2017   BUN 20 09/20/2017   CREATININE 0.6 09/20/2017   Diabetic Labs  (most recent): Lab Results  Component Value Date   HGBA1C 6.5 09/20/2017   HGBA1C 6.2 03/13/2017   HGBA1C 6.3 09/21/2016      Assessment & Plan:   1. RAI  hypothyroidism:  -Her thyroid function tests are consistent with inadequate replacement.  I discussed and increased her levothyroxine to 125 mcg p.o. daily before breakfast.   - We discussed about correct intake of levothyroxine, at fasting, with water, separated by at least 30 minutes from breakfast, and separated by more than 4 hours from calcium, iron, multivitamins, acid reflux medications (PPIs). -Patient is made  aware of the fact that thyroid hormone replacement is needed for life, dose to be adjusted by periodic monitoring of thyroid function tests.   2. Type 2 diabetes mellitus with complication - Her recent A1c is 6.5%. -She is tolerating her metformin.  I advised her to continue  metformin at  500 mg extended release once a day.   she is following wth Jearld Fenton, CDE for DM education. -She is advised to continue atorvastatin 40 mg p.o. nightly.  She previously declined an offer of Qsymia for weight control. she has no contraindications to its use. -  Suggestion is made for her to avoid simple carbohydrates  from her diet including Cakes, Sweet Desserts / Pastries, Ice Cream, Soda (diet and regular), Sweet Tea, Candies, Chips, Cookies, Store Bought Juices, Alcohol in Excess of  1-2 drinks a day, Artificial Sweeteners, and "Sugar-free" Products. This will help patient to have stable blood glucose profile and potentially avoid unintended weight gain.   3. Essential hypertension -Her blood pressure is not controlled to target.  She is following with her cardiologist who is adjusting her losartan to 100 mg p.o. daily, along with amlodipine 5 mg p.o. daily.     4. Osteoporosis:   she has decided to discontinue Alendronate because a family member has had side effects from it. She states that her primary care doctor's office  DEXA scan was "normal".  - I advised patient to maintain close follow up with her PCP for primary care needs.  Follow up plan: Return in about 6 months (around 03/29/2018) for Follow up with Pre-visit Labs.  Glade Lloyd, MD Phone: [phone]  Fax: [phone]  This note was partially dictated with voice recognition software. Similar sounding words can be transcribed inadequately or may not  be corrected upon review.  09/28/2017, 5:06 PM

## 2017-10-25 DIAGNOSIS — Z23 Encounter for immunization: Secondary | ICD-10-CM | POA: Diagnosis not present

## 2017-11-08 IMAGING — CT CT ABD-PELV W/ CM
2 of 5 series · 17 of 46 positions shown, 19 images · IV contrast (Omnipaque 300)
Comparison: Pelvic ultrasound 11/24/2014

CLINICAL DATA: Elevated CA 125. Known right ovarian mass.
Postmenopausal bleeding.

EXAM:
CT ABDOMEN AND PELVIS WITH CONTRAST
TECHNIQUE: Multidetector CT imaging of the abdomen and pelvis was performed
using the standard protocol following bolus administration of
intravenous contrast.
CONTRAST:  100mL OMNIPAQUE IOHEXOL 300 MG/ML  SOLN

[Series 2: abd_pel_with 5.0 b40s · axial · 0.97mm/px · z∈[-616,-182]mm · 14 of 99 slices shown, 16 images]
[im 6/99  soft-tissue]
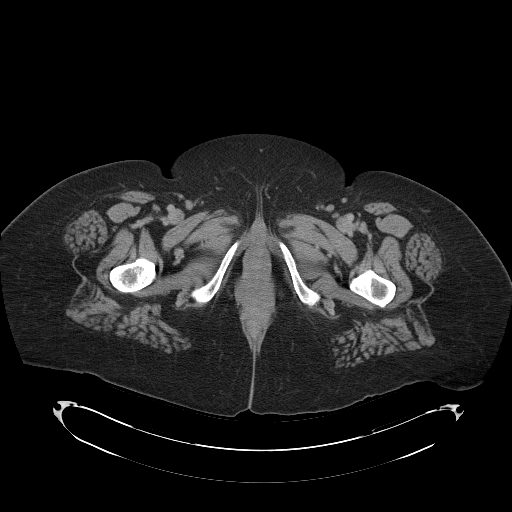
[im 6/99  bone]
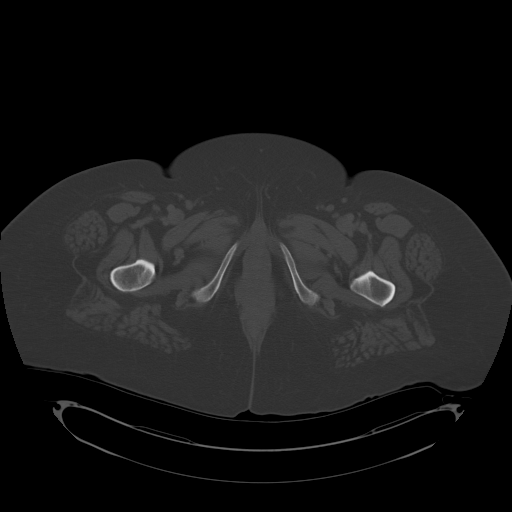
[im 11/99  soft-tissue]
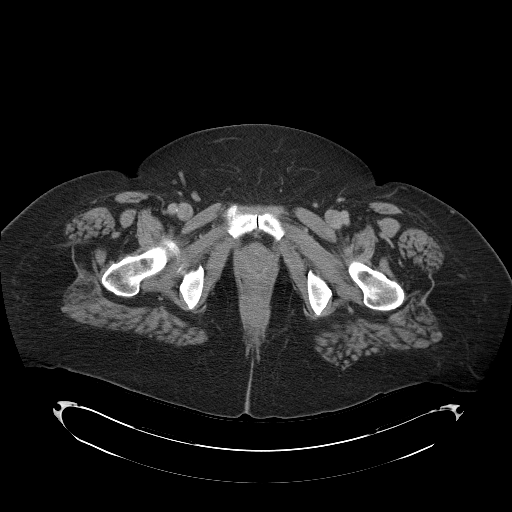
[im 22/99  soft-tissue]
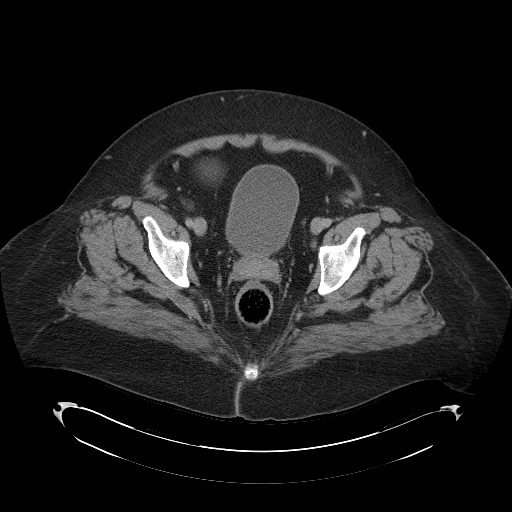
[im 28/99  soft-tissue]
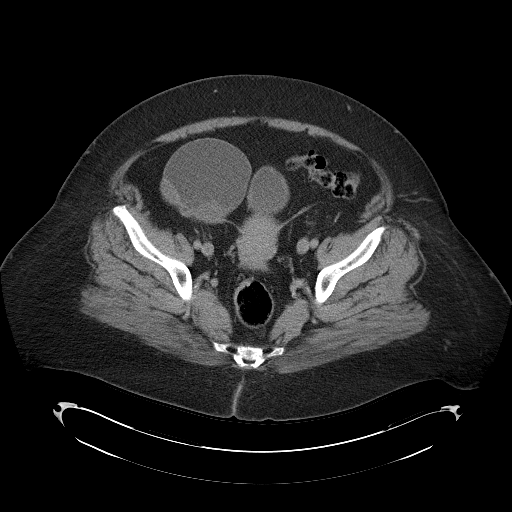
[im 33/99  soft-tissue]
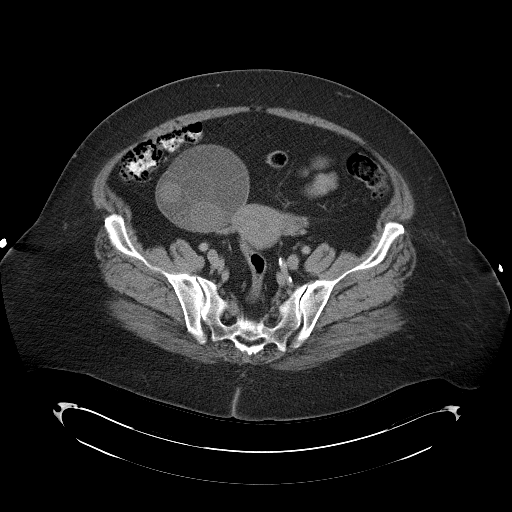
[im 39/99  soft-tissue]
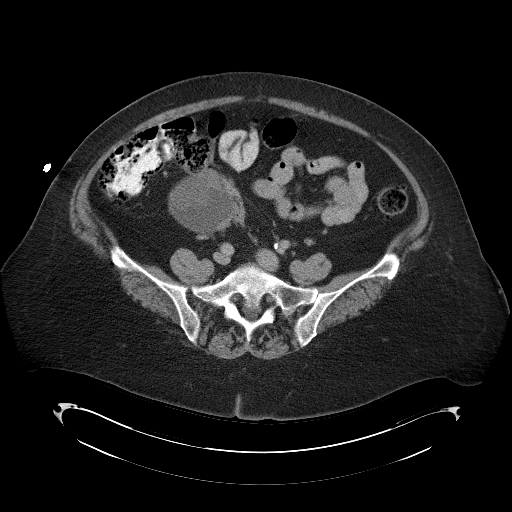
[im 44/99  soft-tissue]
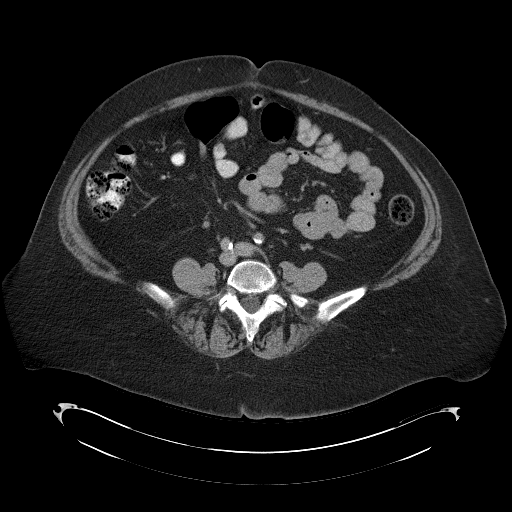
[im 55/99  soft-tissue]
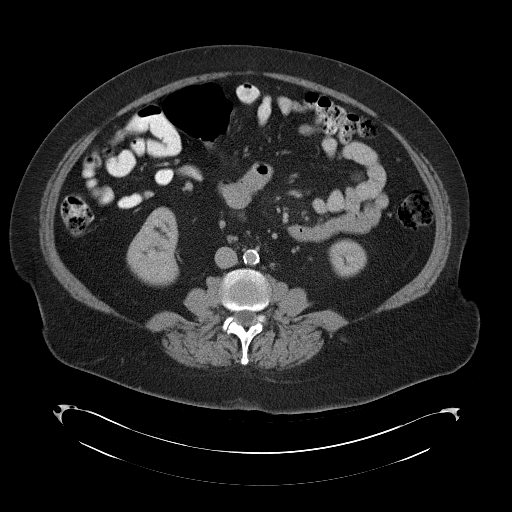
[im 60/99  soft-tissue]
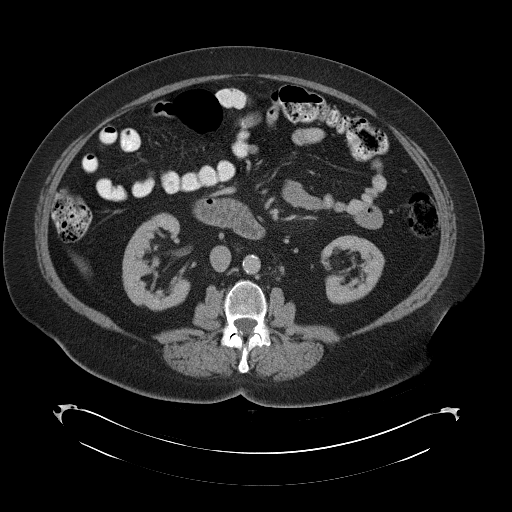
[im 60/99  bone]
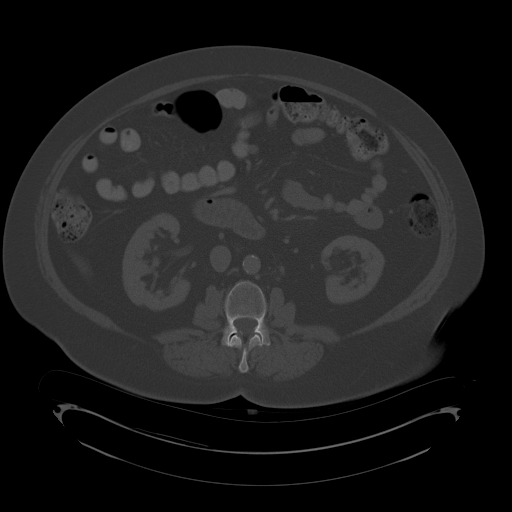
[im 66/99  soft-tissue]
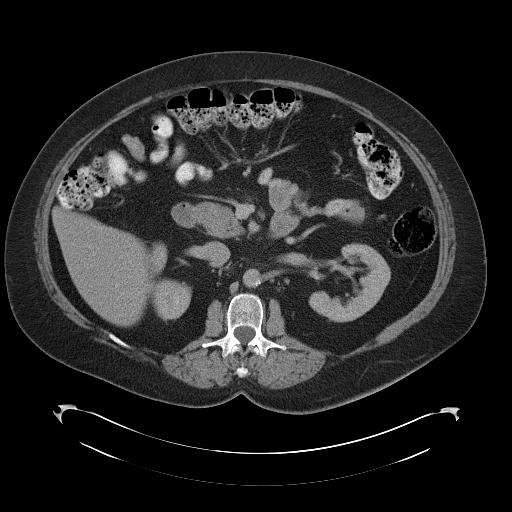
[im 71/99  soft-tissue]
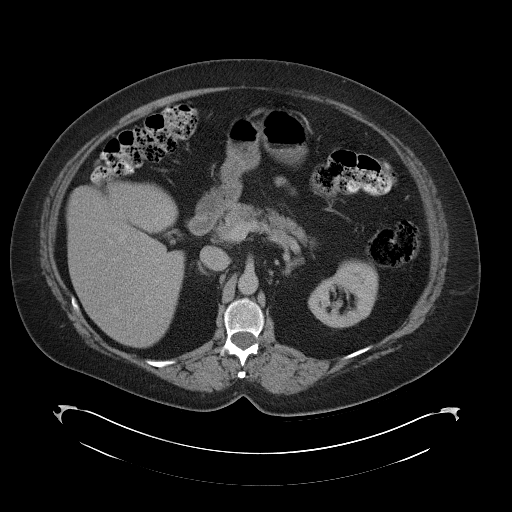
[im 77/99  soft-tissue]
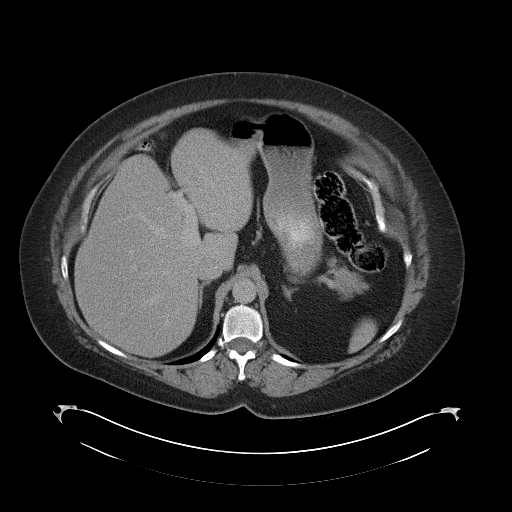
[im 88/99  soft-tissue]
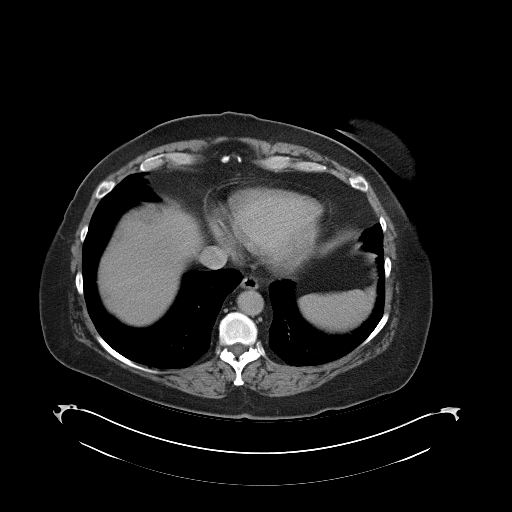
[im 93/99  soft-tissue]
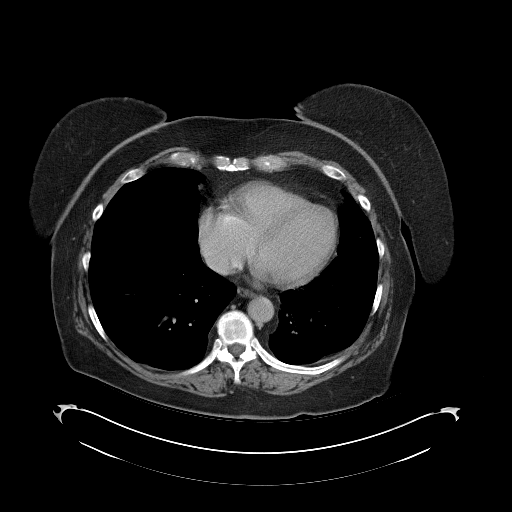

[Series 4: mpr cor post contrast (id) · coronal · 0.96mm/px · 3 of 108 slices shown]
[im 36/108  soft-tissue]
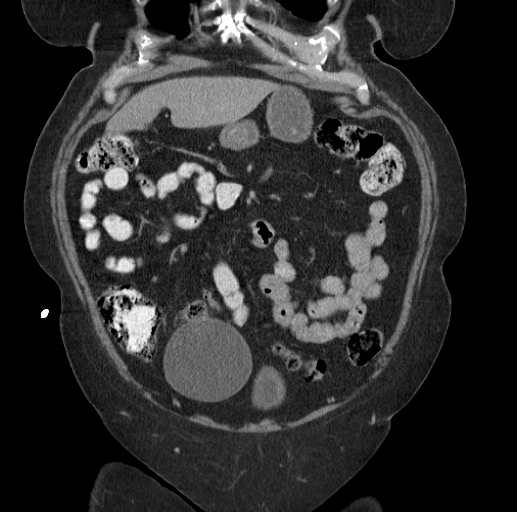
[im 48/108  soft-tissue]
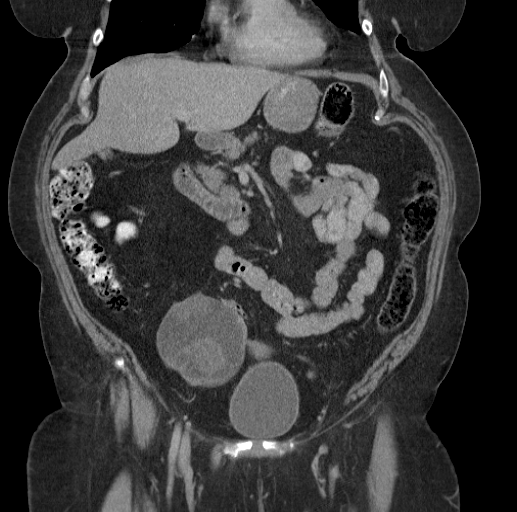
[im 60/108  soft-tissue]
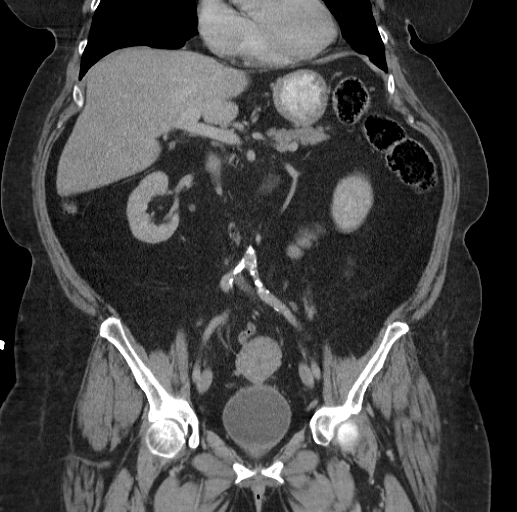

[17 of 46 positions shown; findings below may reference images not displayed]

FINDINGS: Linear densities in the lung bases compatible with scarring or
atelectasis. Heart is normal size. No effusions.

Layering gallstones within the gallbladder. Liver, spleen, pancreas,
adrenals are unremarkable. Tiny punctate nonobstructing stones in
the kidneys bilaterally. No ureteral stones or hydronephrosis.
Stomach, large and small bowel unremarkable.

There is a complex right ovarian cystic mass measuring 9.2 x 9.0 cm.
Internal septations and mural nodularity noted. Findings concerning
for ovarian cancer. No left adnexal mass. Uterus unremarkable.
Urinary bladder unremarkable.

Aorta and iliac vessels are heavily calcified. No adenopathy, free
fluid or free air. No visible omental or peritoneal disease/
nodularity.

No acute bony abnormality. Degenerative disc disease in the lower
lumbar spine.
IMPRESSION: 9.2 cm complex right ovarian cystic mass with mural nodularity.
Findings concerning for ovarian cancer. No visible ascites or intra
abdominal spread currently.

Cholelithiasis.

Nephrolithiasis.

## 2017-11-09 DIAGNOSIS — E119 Type 2 diabetes mellitus without complications: Secondary | ICD-10-CM | POA: Diagnosis not present

## 2017-11-09 DIAGNOSIS — Z7984 Long term (current) use of oral hypoglycemic drugs: Secondary | ICD-10-CM | POA: Diagnosis not present

## 2017-11-09 DIAGNOSIS — H5203 Hypermetropia, bilateral: Secondary | ICD-10-CM | POA: Diagnosis not present

## 2017-11-09 DIAGNOSIS — H2513 Age-related nuclear cataract, bilateral: Secondary | ICD-10-CM | POA: Diagnosis not present

## 2017-11-09 LAB — HM DIABETES EYE EXAM

## 2017-11-15 DIAGNOSIS — E039 Hypothyroidism, unspecified: Secondary | ICD-10-CM | POA: Diagnosis not present

## 2017-11-15 DIAGNOSIS — E119 Type 2 diabetes mellitus without complications: Secondary | ICD-10-CM | POA: Diagnosis not present

## 2017-11-15 DIAGNOSIS — R5382 Chronic fatigue, unspecified: Secondary | ICD-10-CM | POA: Diagnosis not present

## 2017-11-15 DIAGNOSIS — I10 Essential (primary) hypertension: Secondary | ICD-10-CM | POA: Diagnosis not present

## 2017-11-15 DIAGNOSIS — K219 Gastro-esophageal reflux disease without esophagitis: Secondary | ICD-10-CM | POA: Diagnosis not present

## 2017-11-15 DIAGNOSIS — E782 Mixed hyperlipidemia: Secondary | ICD-10-CM | POA: Diagnosis not present

## 2017-11-22 DIAGNOSIS — I10 Essential (primary) hypertension: Secondary | ICD-10-CM | POA: Diagnosis not present

## 2017-11-22 DIAGNOSIS — E039 Hypothyroidism, unspecified: Secondary | ICD-10-CM | POA: Diagnosis not present

## 2017-11-22 DIAGNOSIS — Z6841 Body Mass Index (BMI) 40.0 and over, adult: Secondary | ICD-10-CM | POA: Diagnosis not present

## 2017-11-22 DIAGNOSIS — R002 Palpitations: Secondary | ICD-10-CM | POA: Diagnosis not present

## 2017-11-22 DIAGNOSIS — R5382 Chronic fatigue, unspecified: Secondary | ICD-10-CM | POA: Diagnosis not present

## 2017-11-22 DIAGNOSIS — E782 Mixed hyperlipidemia: Secondary | ICD-10-CM | POA: Diagnosis not present

## 2017-11-22 DIAGNOSIS — M1991 Primary osteoarthritis, unspecified site: Secondary | ICD-10-CM | POA: Diagnosis not present

## 2017-11-22 DIAGNOSIS — R350 Frequency of micturition: Secondary | ICD-10-CM | POA: Diagnosis not present

## 2017-12-18 ENCOUNTER — Other Ambulatory Visit: Payer: Self-pay | Admitting: "Endocrinology

## 2018-01-22 ENCOUNTER — Other Ambulatory Visit: Payer: Self-pay | Admitting: Cardiology

## 2018-01-30 DIAGNOSIS — G4733 Obstructive sleep apnea (adult) (pediatric): Secondary | ICD-10-CM | POA: Diagnosis not present

## 2018-01-30 DIAGNOSIS — Z6841 Body Mass Index (BMI) 40.0 and over, adult: Secondary | ICD-10-CM | POA: Diagnosis not present

## 2018-01-30 DIAGNOSIS — R202 Paresthesia of skin: Secondary | ICD-10-CM | POA: Diagnosis not present

## 2018-02-21 ENCOUNTER — Encounter: Payer: Self-pay | Admitting: "Endocrinology

## 2018-02-21 DIAGNOSIS — E038 Other specified hypothyroidism: Secondary | ICD-10-CM | POA: Diagnosis not present

## 2018-02-21 DIAGNOSIS — E118 Type 2 diabetes mellitus with unspecified complications: Secondary | ICD-10-CM | POA: Diagnosis not present

## 2018-03-13 ENCOUNTER — Encounter (INDEPENDENT_AMBULATORY_CARE_PROVIDER_SITE_OTHER): Payer: Self-pay | Admitting: *Deleted

## 2018-03-19 DIAGNOSIS — E039 Hypothyroidism, unspecified: Secondary | ICD-10-CM | POA: Diagnosis not present

## 2018-03-19 DIAGNOSIS — E782 Mixed hyperlipidemia: Secondary | ICD-10-CM | POA: Diagnosis not present

## 2018-03-19 DIAGNOSIS — K219 Gastro-esophageal reflux disease without esophagitis: Secondary | ICD-10-CM | POA: Diagnosis not present

## 2018-03-19 DIAGNOSIS — E119 Type 2 diabetes mellitus without complications: Secondary | ICD-10-CM | POA: Diagnosis not present

## 2018-03-19 DIAGNOSIS — I10 Essential (primary) hypertension: Secondary | ICD-10-CM | POA: Diagnosis not present

## 2018-03-19 DIAGNOSIS — R5382 Chronic fatigue, unspecified: Secondary | ICD-10-CM | POA: Diagnosis not present

## 2018-03-19 LAB — LIPID PANEL
Cholesterol: 119 (ref 0–200)
HDL: 41 (ref 35–70)
LDL CALC: 58
Triglycerides: 99 (ref 40–160)

## 2018-03-19 LAB — BASIC METABOLIC PANEL
BUN: 17 (ref 4–21)
Creatinine: 0.7 (ref 0.5–1.1)

## 2018-03-19 LAB — HEMOGLOBIN A1C: Hemoglobin A1C: 6.7

## 2018-03-21 DIAGNOSIS — R350 Frequency of micturition: Secondary | ICD-10-CM | POA: Diagnosis not present

## 2018-03-21 DIAGNOSIS — R202 Paresthesia of skin: Secondary | ICD-10-CM | POA: Diagnosis not present

## 2018-03-21 DIAGNOSIS — E039 Hypothyroidism, unspecified: Secondary | ICD-10-CM | POA: Diagnosis not present

## 2018-03-21 DIAGNOSIS — G4733 Obstructive sleep apnea (adult) (pediatric): Secondary | ICD-10-CM | POA: Diagnosis not present

## 2018-03-21 DIAGNOSIS — I10 Essential (primary) hypertension: Secondary | ICD-10-CM | POA: Diagnosis not present

## 2018-03-21 DIAGNOSIS — E782 Mixed hyperlipidemia: Secondary | ICD-10-CM | POA: Diagnosis not present

## 2018-03-21 DIAGNOSIS — R5382 Chronic fatigue, unspecified: Secondary | ICD-10-CM | POA: Diagnosis not present

## 2018-03-21 DIAGNOSIS — R002 Palpitations: Secondary | ICD-10-CM | POA: Diagnosis not present

## 2018-03-29 ENCOUNTER — Other Ambulatory Visit: Payer: Self-pay

## 2018-03-29 ENCOUNTER — Ambulatory Visit (INDEPENDENT_AMBULATORY_CARE_PROVIDER_SITE_OTHER): Payer: Medicare Other | Admitting: "Endocrinology

## 2018-03-29 ENCOUNTER — Encounter: Payer: Self-pay | Admitting: "Endocrinology

## 2018-03-29 VITALS — BP 133/83 | HR 84 | Ht 68.0 in | Wt 280.0 lb

## 2018-03-29 DIAGNOSIS — E038 Other specified hypothyroidism: Secondary | ICD-10-CM

## 2018-03-29 DIAGNOSIS — E782 Mixed hyperlipidemia: Secondary | ICD-10-CM

## 2018-03-29 DIAGNOSIS — E118 Type 2 diabetes mellitus with unspecified complications: Secondary | ICD-10-CM | POA: Diagnosis not present

## 2018-03-29 DIAGNOSIS — I10 Essential (primary) hypertension: Secondary | ICD-10-CM | POA: Diagnosis not present

## 2018-03-29 MED ORDER — LEVOTHYROXINE SODIUM 125 MCG PO TABS: 125.0000 ug | ORAL_TABLET | Freq: Every day | ORAL | 3 refills | Status: DC

## 2018-03-29 MED ORDER — METFORMIN HCL ER 500 MG PO TB24: ORAL_TABLET | ORAL | 3 refills | Status: DC

## 2018-03-29 NOTE — Progress Notes (Signed)
Endocrinology follow-up note   Subjective:    Patient ID: Hannah Barrett, female    DOB: July 04, 1950, PCP Sasser, Silvestre Moment, MD   Past Medical History:  Diagnosis Date  . Arthritis   . Diabetes mellitus without complication ([place])   . History of endometrial cancer 01/05/2016  . Hyperlipidemia   . Hypertension   . Overactive bladder   . PMB (postmenopausal bleeding) 11/18/2014  . Thyroid disease    Past Surgical History:  Procedure Laterality Date  . COLONOSCOPY    . COLONOSCOPY N/A 04/09/2015   Procedure: COLONOSCOPY;  Surgeon: Rogene Houston, MD;  [facility];  Service: Endoscopy;  Laterality: N/A;  1030  . CYSTOSCOPY    . POLYPECTOMY  04/09/2015   Procedure: POLYPECTOMY;  Surgeon: Rogene Houston, MD;  [facility]: [facility];  Service: Endoscopy;;  Ascending colon polyp removed via hot snare  . ROBOTIC ASSISTED TOTAL HYSTERECTOMY WITH BILATERAL SALPINGO OOPHERECTOMY Bilateral 12/18/2014   Procedure: XI ROBOTIC ASSISTED TOTAL HYSTERECTOMY WITH BILATERAL SALPINGO OOPHORECTOMY;  Surgeon: Everitt Amber, MD;  Location: [facility];  Service: Gynecology;  Laterality: Bilateral;   Social History   Socioeconomic History  . Marital status: Married    Spouse name: Not on file  . Number of children: Not on file  . Years of education: Not on file  . Highest education level: Not on file  Occupational History  . Not on file  Social Needs  . Financial resource strain: Not on file  . Food insecurity:    Worry: Not on file    Inability: Not on file  . Transportation needs:    Medical: Not on file    Non-medical: Not on file  Tobacco Use  . Smoking status: Former Smoker    Packs/day: 2.00    Years: 36.00    Pack years: 72.00    Types: Cigarettes    Start date: 03/03/1968    Last attempt to quit: 10/27/2004    Years since quitting: 13.4  . Smokeless tobacco: Never Used  Substance and Sexual Activity  . Alcohol use: No  . Drug use: No  . Sexual activity: Not Currently     Birth control/protection: Post-menopausal, Surgical    Comment: hyst  Lifestyle  . Physical activity:    Days per week: Not on file    Minutes per session: Not on file  . Stress: Not on file  Relationships  . Social connections:    Talks on phone: Not on file    Gets together: Not on file    Attends religious service: Not on file    Active member of club or organization: Not on file    Attends meetings of clubs or organizations: Not on file    Relationship status: Not on file  Other Topics Concern  . Not on file  Social History Narrative  . Not on file   Outpatient Encounter Medications as of 03/29/2018  Medication Sig  . UNABLE TO FIND Med Name: tumeric po qd  . acetaminophen (TYLENOL) 650 MG CR tablet Take 650 mg by mouth every 8 (eight) hours as needed for pain. Tylenol Arthritis two  tablets in the morning and two tablets in the evening  . amLODipine (NORVASC) 10 MG tablet Take 10 mg by mouth daily.  Marland Kitchen atorvastatin (LIPITOR) 40 MG tablet TAKE 1 TABLET BY MOUTH EVERY DAY  . levothyroxine (SYNTHROID, LEVOTHROID) 125 MCG tablet Take 1 tablet (125 mcg total) by mouth daily before breakfast.  .  losartan (COZAAR) 100 MG tablet Take 1 tablet (100 mg total) by mouth daily.  . metFORMIN (GLUCOPHAGE-XR) 500 MG 24 hr tablet TAKE 1 TABLET BY MOUTH EVERY DAY WITH BREAKFAST  . Misc Natural Products (TART CHERRY ADVANCED) CAPS Take by mouth daily.  . Multiple Vitamins-Minerals (CENTRUM SILVER PO) Take by mouth daily.   . [DISCONTINUED] levothyroxine (SYNTHROID, LEVOTHROID) 125 MCG tablet Take 1 tablet (125 mcg total) by mouth daily before breakfast.  . [DISCONTINUED] metFORMIN (GLUCOPHAGE-XR) 500 MG 24 hr tablet TAKE 1 TABLET BY MOUTH EVERY DAY WITH BREAKFAST   No facility-administered encounter medications on file as of 03/29/2018.    ALLERGIES: No Known Allergies VACCINATION STATUS:  There is no immunization history on file for this patient.  68 yr old with hx of RAI induced  hypothyroidism, and controlled type 2 DM .  She is here for follow-up for hypothyroidism, type 2 diabetes, hypertension, hyperlipidemia.    -She is currently on levothyroxine 125 mcg p.o. every morning.  She is compliant, has no new complaints.  She denies heat intolerance, palpitations, and tremors.  -Her previsit A1c 6.7%, she is tolerating her metformin 500 mg ER once daily at breakfast.  Review of Systems  Constitutional:  + Progressive weight gain,  no subjective hyperthermia/hypothermia Eyes: no blurry vision, no xerophthalmia ENT: no sore throat, no nodules palpated in throat, no dysphagia/odynophagia, no hoarseness Cardiovascular: no chest pain, no palpitations, no leg swelling.   aches Skin: no rashes Neurological: No tremors, no numbness, no tingling.  Psychiatric: no depression/anxiety  Objective:    BP 133/83   Pulse 84   Ht 5\' 8"  (1.727 m)   Wt 280 lb (127 kg)   BMI 42.57 kg/m   Wt Readings from Last 3 Encounters:  03/29/18 280 lb (127 kg)  09/28/17 278 lb (126.1 kg)  07/31/17 276 lb (125.2 kg)    Physical Exam  Constitutional: obese, not in acute distress.   Eyes: PERRLA, EOMI, no exophthalmos ENT: moist mucous membranes, no thyromegaly, no cervical lymphadenopathy  Musculoskeletal: no deformities, strength intact in all 4 extremities.   Skin: moist, warm, no rashes Neurological: no tremor with outstretched hands   Results for orders placed or performed in visit on 63/14/97  Basic metabolic panel  Result Value Ref Range   BUN 17 4 - 21   Creatinine 0.7 0.5 - 1.1  Lipid panel  Result Value Ref Range   Triglycerides 99 40 - 160   Cholesterol 119 0 - 200   HDL 41 35 - 70   LDL Cholesterol 58   Hemoglobin A1c  Result Value Ref Range   Hemoglobin A1C 6.7    Complete Blood Count (Most recent): Lab Results  Component Value Date   WBC 12.6 (H) 12/19/2014   HGB 11.1 (L) 12/19/2014   HCT 34.8 (L) 12/19/2014   MCV 93.5 12/19/2014   PLT 260 12/19/2014    Chemistry (most recent): Lab Results  Component Value Date   NA 142 08/14/2017   K 4.0 08/14/2017   CL 107 08/14/2017   CO2 27 08/14/2017   BUN 17 03/19/2018   CREATININE 0.7 03/19/2018   Diabetic Labs (most recent): Lab Results  Component Value Date   HGBA1C 6.7 03/19/2018   HGBA1C 6.5 09/20/2017   HGBA1C 6.2 03/13/2017      Assessment & Plan:   1. RAI  hypothyroidism:  -Her thyroid function tests are consistent with adequate replacement.  She is advised to continue levothyroxine 125 mcg p.o. daily half  an hour before breakfast.    - We discussed about the correct intake of her thyroid hormone, on empty stomach at fasting, with water, separated by at least 30 minutes from breakfast and other medications,  and separated by more than 4 hours from calcium, iron, multivitamins, acid reflux medications (PPIs). -Patient is made aware of the fact that thyroid hormone replacement is needed for life, dose to be adjusted by periodic monitoring of thyroid function tests.    2. Type 2 diabetes mellitus with complication - Her recent A1c is 6.7%. -She is tolerating her metformin.  She is advised to continue metformin at  500 mg extended release once a day.   she is following with Jearld Fenton, CDE for DM education. -She has responded to statin treatment with improvement of her LDL to 58.  She is advised to continue atorvastatin 40 mg p.o. nightly.     She previously declined an offer of Qsymia for weight control. she has no contraindications to its use.  - Patient admits there is a room for improvement in her diet and drink choices. -  Suggestion is made for her to avoid simple carbohydrates  from her diet including Cakes, Sweet Desserts / Pastries, Ice Cream, Soda (diet and regular), Sweet Tea, Candies, Chips, Cookies, Store Bought Juices, Alcohol in Excess of  1-2 drinks a day, Artificial Sweeteners, and "Sugar-free" Products. This will help patient to have stable blood glucose  profile and potentially avoid unintended weight gain.    3. Essential hypertension -Her blood pressure is not controlled to target.  She is following with her cardiologist who is adjusting her losartan to 100 mg p.o. daily, along with amlodipine 5 mg p.o. daily.     4. Osteoporosis:   she has decided to discontinue Alendronate because a family member has had side effects from it. She states that her primary care doctor's office DEXA scan was "normal".  - I advised patient to maintain close follow up with her PCP for primary care needs.  Follow up plan: Return in about 1 year (around 03/29/2019) for Follow up with Pre-visit Labs.  Glade Lloyd, MD Phone: [phone]  Fax: [phone]  This note was partially dictated with voice recognition software. Similar sounding words can be transcribed inadequately or may not  be corrected upon review.  03/29/2018, 3:25 PM

## 2018-03-29 NOTE — Patient Instructions (Signed)

## 2018-04-23 ENCOUNTER — Other Ambulatory Visit: Payer: Self-pay | Admitting: Cardiology

## 2018-04-23 MED ORDER — LOSARTAN POTASSIUM 100 MG PO TABS: 100.0000 mg | ORAL_TABLET | Freq: Every day | ORAL | 2 refills | Status: DC

## 2018-04-23 NOTE — Telephone Encounter (Signed)
°*  STAT* If patient is at the pharmacy, call can be transferred to refill team.   1. Which medications need to be refilled?  losartan (COZAAR) 100 MG tablet     2. Which pharmacy/location (including street and city if local pharmacy) is medication to be sent to?  [facility]  3. Do they need a 30 day or 90 day supply? 90   Prescribed Refills: 2

## 2018-04-23 NOTE — Telephone Encounter (Signed)
Medication sent to pharmacy  

## 2018-06-26 DIAGNOSIS — Z1231 Encounter for screening mammogram for malignant neoplasm of breast: Secondary | ICD-10-CM | POA: Diagnosis not present

## 2018-07-10 ENCOUNTER — Telehealth: Payer: Self-pay | Admitting: *Deleted

## 2018-07-10 DIAGNOSIS — R002 Palpitations: Secondary | ICD-10-CM | POA: Diagnosis not present

## 2018-07-10 DIAGNOSIS — I10 Essential (primary) hypertension: Secondary | ICD-10-CM | POA: Diagnosis not present

## 2018-07-10 DIAGNOSIS — K219 Gastro-esophageal reflux disease without esophagitis: Secondary | ICD-10-CM | POA: Diagnosis not present

## 2018-07-10 DIAGNOSIS — E039 Hypothyroidism, unspecified: Secondary | ICD-10-CM | POA: Diagnosis not present

## 2018-07-10 DIAGNOSIS — E119 Type 2 diabetes mellitus without complications: Secondary | ICD-10-CM | POA: Diagnosis not present

## 2018-07-10 DIAGNOSIS — E782 Mixed hyperlipidemia: Secondary | ICD-10-CM | POA: Diagnosis not present

## 2018-07-10 DIAGNOSIS — R5382 Chronic fatigue, unspecified: Secondary | ICD-10-CM | POA: Diagnosis not present

## 2018-07-10 NOTE — Telephone Encounter (Signed)
Patient called and scheduled an appt for 7/24

## 2018-07-11 DIAGNOSIS — R928 Other abnormal and inconclusive findings on diagnostic imaging of breast: Secondary | ICD-10-CM | POA: Diagnosis not present

## 2018-07-11 DIAGNOSIS — N6489 Other specified disorders of breast: Secondary | ICD-10-CM | POA: Diagnosis not present

## 2018-07-18 DIAGNOSIS — Z6841 Body Mass Index (BMI) 40.0 and over, adult: Secondary | ICD-10-CM | POA: Diagnosis not present

## 2018-07-18 DIAGNOSIS — E039 Hypothyroidism, unspecified: Secondary | ICD-10-CM | POA: Diagnosis not present

## 2018-07-18 DIAGNOSIS — E782 Mixed hyperlipidemia: Secondary | ICD-10-CM | POA: Diagnosis not present

## 2018-07-18 DIAGNOSIS — Z0000 Encounter for general adult medical examination without abnormal findings: Secondary | ICD-10-CM | POA: Diagnosis not present

## 2018-07-18 DIAGNOSIS — K219 Gastro-esophageal reflux disease without esophagitis: Secondary | ICD-10-CM | POA: Diagnosis not present

## 2018-07-18 DIAGNOSIS — G4733 Obstructive sleep apnea (adult) (pediatric): Secondary | ICD-10-CM | POA: Diagnosis not present

## 2018-07-18 DIAGNOSIS — M1991 Primary osteoarthritis, unspecified site: Secondary | ICD-10-CM | POA: Diagnosis not present

## 2018-07-18 DIAGNOSIS — I10 Essential (primary) hypertension: Secondary | ICD-10-CM | POA: Diagnosis not present

## 2018-07-23 ENCOUNTER — Other Ambulatory Visit: Payer: Self-pay | Admitting: Cardiology

## 2018-07-24 ENCOUNTER — Encounter (INDEPENDENT_AMBULATORY_CARE_PROVIDER_SITE_OTHER): Payer: Self-pay | Admitting: *Deleted

## 2018-07-25 ENCOUNTER — Other Ambulatory Visit: Payer: Self-pay

## 2018-07-25 ENCOUNTER — Telehealth (INDEPENDENT_AMBULATORY_CARE_PROVIDER_SITE_OTHER): Payer: Self-pay | Admitting: *Deleted

## 2018-07-25 ENCOUNTER — Encounter (INDEPENDENT_AMBULATORY_CARE_PROVIDER_SITE_OTHER): Payer: Self-pay | Admitting: Internal Medicine

## 2018-07-25 ENCOUNTER — Encounter (INDEPENDENT_AMBULATORY_CARE_PROVIDER_SITE_OTHER): Payer: Self-pay | Admitting: *Deleted

## 2018-07-25 ENCOUNTER — Ambulatory Visit (INDEPENDENT_AMBULATORY_CARE_PROVIDER_SITE_OTHER): Payer: Medicare Other | Admitting: Internal Medicine

## 2018-07-25 VITALS — BP 178/95 | HR 87 | Temp 99.2°F | Ht 68.0 in | Wt 283.8 lb

## 2018-07-25 DIAGNOSIS — K5732 Diverticulitis of large intestine without perforation or abscess without bleeding: Secondary | ICD-10-CM

## 2018-07-25 DIAGNOSIS — Z86010 Personal history of colonic polyps: Secondary | ICD-10-CM

## 2018-07-25 MED ORDER — PEG 3350-KCL-NA BICARB-NACL 420 G PO SOLR: 4000.0000 mL | Freq: Once | ORAL | 0 refills | Status: AC

## 2018-07-25 NOTE — Patient Instructions (Signed)
The risks of bleeding, perforation and infection were reviewed with patient.  

## 2018-07-25 NOTE — Telephone Encounter (Signed)
Patient needs trilyte 

## 2018-07-25 NOTE — Progress Notes (Signed)
   Subjective:    Patient ID: Hannah Barrett, female    DOB: 04/03/1950, 68 y.o.   MRN: [id number]  HPI Referred by Dr. Quintin Alto for LLQ pain/colonoscopy. Recently treated empirically by Kassie Mends PA for diverticulitis several months ago. . Covered with Cipro and Flagyl. She tells me she is doing okay. No GI complaints. No nausea or vomiting.  Her appetite is good. No weight loss.  BMs are moving okay.    Last colonoscopy in March of 2017. 16 mm polyp removed with hot snare. Biopsy: sessile serrated polyp. Next colonoscopy 3 years.  Review of Systems Past Medical History:  Diagnosis Date  . Arthritis   . Diabetes mellitus without complication ([place])   . History of endometrial cancer 01/05/2016  . Hyperlipidemia   . Hypertension   . Overactive bladder   . PMB (postmenopausal bleeding) 11/18/2014  . Thyroid disease     Past Surgical History:  Procedure Laterality Date  . COLONOSCOPY    . COLONOSCOPY N/A 04/09/2015   Procedure: COLONOSCOPY;  Surgeon: Rogene Houston, MD;  [facility];  Service: Endoscopy;  Laterality: N/A;  1030  . CYSTOSCOPY    . POLYPECTOMY  04/09/2015   Procedure: POLYPECTOMY;  Surgeon: Rogene Houston, MD;  [facility];  Service: Endoscopy;;  Ascending colon polyp removed via hot snare  . ROBOTIC ASSISTED TOTAL HYSTERECTOMY WITH BILATERAL SALPINGO OOPHERECTOMY Bilateral 12/18/2014   Procedure: XI ROBOTIC ASSISTED TOTAL HYSTERECTOMY WITH BILATERAL SALPINGO OOPHORECTOMY;  Surgeon: Everitt Amber, MD;  Location: [facility];  Service: Gynecology;  Laterality: Bilateral;    No Known Allergies  Current Outpatient Medications on File Prior to Visit  Medication Sig Dispense Refill  . acetaminophen (TYLENOL) 650 MG CR tablet Take 650 mg by mouth every 8 (eight) hours as needed for pain. Tylenol Arthritis two  tablets in the morning and two tablets in the evening    . amLODipine (NORVASC) 10 MG tablet Take 10 mg by mouth daily.    Marland Kitchen atorvastatin (LIPITOR)  40 MG tablet TAKE 1 TABLET BY MOUTH EVERY DAY 90 tablet 2  . levothyroxine (SYNTHROID, LEVOTHROID) 125 MCG tablet Take 1 tablet (125 mcg total) by mouth daily before breakfast. 90 tablet 3  . metFORMIN (GLUCOPHAGE-XR) 500 MG 24 hr tablet TAKE 1 TABLET BY MOUTH EVERY DAY WITH BREAKFAST 90 tablet 3  . Multiple Vitamins-Minerals (CENTRUM SILVER PO) Take by mouth daily.     Marland Kitchen UNABLE TO FIND Med Name: tumeric po qd    . losartan (COZAAR) 100 MG tablet Take 1 tablet (100 mg total) by mouth daily. (Patient not taking: Reported on 07/25/2018) 90 tablet 2  . Misc Natural Products (TART CHERRY ADVANCED) CAPS Take by mouth daily.     No current facility-administered medications on file prior to visit.         Objective:   Physical Exam Blood pressure (!) 178/95, pulse 87, temperature 99.2 F (37.3 C), height 5\' 8"  (1.727 m), weight 283 lb 12.8 oz (128.7 kg). Alert and oriented. Skin warm and dry. Oral mucosa is moist.   . Sclera anicteric, conjunctivae is pink. Thyroid not enlarged. No cervical lymphadenopathy. Lungs clear. Heart regular rate and rhythm.  Abdomen is soft. Bowel sounds are positive. No hepatomegaly. No abdominal masses felt. No tenderness.  No edema to lower extremities.         Assessment & Plan:  LLQ pain/diverticulitis: resolved. She is doing well. No GI complaints. Colon polyp: Due for surveillance.

## 2018-08-07 DIAGNOSIS — M810 Age-related osteoporosis without current pathological fracture: Secondary | ICD-10-CM | POA: Diagnosis not present

## 2018-08-07 DIAGNOSIS — M8588 Other specified disorders of bone density and structure, other site: Secondary | ICD-10-CM | POA: Diagnosis not present

## 2018-08-07 DIAGNOSIS — M85852 Other specified disorders of bone density and structure, left thigh: Secondary | ICD-10-CM | POA: Diagnosis not present

## 2018-08-10 ENCOUNTER — Other Ambulatory Visit: Payer: Self-pay

## 2018-08-10 ENCOUNTER — Inpatient Hospital Stay (HOSPITAL_BASED_OUTPATIENT_CLINIC_OR_DEPARTMENT_OTHER): Payer: Medicare Other | Admitting: Gynecologic Oncology

## 2018-08-10 ENCOUNTER — Inpatient Hospital Stay: Payer: Medicare Other | Attending: Gynecologic Oncology

## 2018-08-10 ENCOUNTER — Encounter: Payer: Self-pay | Admitting: Gynecologic Oncology

## 2018-08-10 VITALS — BP 178/70 | HR 73 | Temp 99.1°F | Resp 18 | Ht 68.0 in | Wt 284.1 lb

## 2018-08-10 DIAGNOSIS — Z79899 Other long term (current) drug therapy: Secondary | ICD-10-CM | POA: Diagnosis not present

## 2018-08-10 DIAGNOSIS — E119 Type 2 diabetes mellitus without complications: Secondary | ICD-10-CM | POA: Diagnosis not present

## 2018-08-10 DIAGNOSIS — M1990 Unspecified osteoarthritis, unspecified site: Secondary | ICD-10-CM | POA: Insufficient documentation

## 2018-08-10 DIAGNOSIS — Z90710 Acquired absence of both cervix and uterus: Secondary | ICD-10-CM | POA: Insufficient documentation

## 2018-08-10 DIAGNOSIS — Z90722 Acquired absence of ovaries, bilateral: Secondary | ICD-10-CM | POA: Diagnosis not present

## 2018-08-10 DIAGNOSIS — K5900 Constipation, unspecified: Secondary | ICD-10-CM | POA: Diagnosis not present

## 2018-08-10 DIAGNOSIS — Z87891 Personal history of nicotine dependence: Secondary | ICD-10-CM | POA: Insufficient documentation

## 2018-08-10 DIAGNOSIS — Z7984 Long term (current) use of oral hypoglycemic drugs: Secondary | ICD-10-CM | POA: Insufficient documentation

## 2018-08-10 DIAGNOSIS — D3911 Neoplasm of uncertain behavior of right ovary: Secondary | ICD-10-CM

## 2018-08-10 DIAGNOSIS — I10 Essential (primary) hypertension: Secondary | ICD-10-CM | POA: Insufficient documentation

## 2018-08-10 DIAGNOSIS — E785 Hyperlipidemia, unspecified: Secondary | ICD-10-CM | POA: Diagnosis not present

## 2018-08-10 DIAGNOSIS — C541 Malignant neoplasm of endometrium: Secondary | ICD-10-CM | POA: Diagnosis not present

## 2018-08-10 NOTE — Patient Instructions (Addendum)
Please notify Dr Denman George at phone number [phone] if you notice vaginal bleeding, new pelvic or abdominal pains, bloating, feeling full easy, or a change in bladder or bowel function.   Please contact Dr Serita Grit office (at [phone]) in March, 2021 to request an appointment with her for July, 2021.  Dr Denman George will check your tumor marker today.  Dr Denman George recommends follow-up with your PCP to check your blood pressure as it is elevated.

## 2018-08-10 NOTE — Progress Notes (Signed)
FOLLOW UP NOTE  Chief Complaint:  Chief Complaint  Patient presents with  . endometrial cancer    follow-up  . ovarian LMP    follow-up    Assessment:    68 y.o. year old with Stage IC right serous LMP and stage IA grade1 endometrioid endometrial cancer.   S/p robotic hysterectomy, BSO on 12/18/14.  She had low risk features in her tumor and therefore no adjuvant therapy was recommended. Preoperative CA 125 was elevated.   No evidence for recurrence on today's exam  HTN - poorly controlled  Plan: CA 125 today  Discussed signs and symptoms of recurrence including vaginal bleeding or discharge, leg pain or swelling and changes in bowel or bladder habits.  Return to clinic in 6 months to see Derrek Monaco and in 12 months to see me. Pap is not necessary as part of endometrial cancer surveillance and she no longer meets dysplasia surveillance criteria.   She will see Dr Quintin Alto or discuss with his office that her BP is poorly controlled.   HPI:  Hannah Barrett is a very pleasant 68 year old G1P1 who is seen in consultation at the request of Derrek Monaco (NP) for a complex right ovarian cyst. The patient reports having some bloody and mucoid discharge for approximately 1 year. She ignored it after her father died in Mar 30, 2014, but allerted her physicians to this in October, 2016. At that time they performed an [place] of thepelvis on 11/24/14 which showed an 8.7cm uterus with 77mm endometrial stripe and a 10cm right ovarian cyst with thin septations. The left ovary was normal.  The CT of the abdomen and pelvis on 11/27/14 featured a 9.2cm complex right ovarian cyst with mural nodularity. There was no ascites or peritoneal nodularity or adenopathy.  CA 125 on 11/25/14 was elevated at 238.4 U/mL.  She then underwent a robotic hysterectomy, BSO, on 38/7/56 without complications.  Her postoperative course was uncomplicated.  Her final pathologic diagnosis is a Stage IA Grade 1 endometrioid  endometrial cancer with no lymphovascular space invasion, no myometrial invasion, this was incidentally found. The right ovary contained a serous low malignant potential tumor with no surface involvement. Washings were positive.  Interval Hx: She is seen today for a routine surveillance. She is doing well. She has no complaints including no bleeding or pelvic pain. She has intermittent constipation.  CA 125 on 07/13/15 was normal at 5. CA 125 on 07/11/16 was normal at 6.2 Pap with provider Derrek Monaco was normal with negative hr HPV in January, 2019.   Past Medical History:  Diagnosis Date  . Arthritis   . Diabetes mellitus without complication ([place])   . History of endometrial cancer 01/05/2016  . Hyperlipidemia   . Hypertension   . Overactive bladder   . PMB (postmenopausal bleeding) 11/18/2014  . Thyroid disease    Past Surgical History:  Procedure Laterality Date  . COLONOSCOPY    . COLONOSCOPY N/A 04/09/2015   Procedure: COLONOSCOPY;  Surgeon: Rogene Houston, MD;  [facility];  Service: Endoscopy;  Laterality: N/A;  1030  . CYSTOSCOPY    . POLYPECTOMY  04/09/2015   Procedure: POLYPECTOMY;  Surgeon: Rogene Houston, MD;  [facility];  Service: Endoscopy;;  Ascending colon polyp removed via hot snare  . ROBOTIC ASSISTED TOTAL HYSTERECTOMY WITH BILATERAL SALPINGO OOPHERECTOMY Bilateral 12/18/2014   Procedure: XI ROBOTIC ASSISTED TOTAL HYSTERECTOMY WITH BILATERAL SALPINGO OOPHORECTOMY;  Surgeon: Everitt Amber, MD;  Location: [facility];  Service: Gynecology;  Laterality: Bilateral;   Family History  Problem Relation Age of Onset  . Congestive Heart Failure Mother   . Arthritis Father   . Other Father        killed in [place]  . Other Sister        brain injury after fall  . Cancer Sister        multiple mylemoma  . Obesity Daughter   . Diabetes Maternal Grandmother   . Parkinson's disease Paternal Grandfather    Social History   Socioeconomic History  .  Marital status: Married    Spouse name: Not on file  . Number of children: Not on file  . Years of education: Not on file  . Highest education level: Not on file  Occupational History  . Not on file  Social Needs  . Financial resource strain: Not on file  . Food insecurity    Worry: Not on file    Inability: Not on file  . Transportation needs    Medical: Not on file    Non-medical: Not on file  Tobacco Use  . Smoking status: Former Smoker    Packs/day: 2.00    Years: 36.00    Pack years: 72.00    Types: Cigarettes    Start date: 03/03/1968    Quit date: 10/27/2004    Years since quitting: 13.7  . Smokeless tobacco: Never Used  Substance and Sexual Activity  . Alcohol use: No  . Drug use: No  . Sexual activity: Not Currently    Birth control/protection: Post-menopausal, Surgical    Comment: hyst  Lifestyle  . Physical activity    Days per week: Not on file    Minutes per session: Not on file  . Stress: Not on file  Relationships  . Social Herbalist on phone: Not on file    Gets together: Not on file    Attends religious service: Not on file    Active member of club or organization: Not on file    Attends meetings of clubs or organizations: Not on file    Relationship status: Not on file  . Intimate partner violence    Fear of current or ex partner: Not on file    Emotionally abused: Not on file    Physically abused: Not on file    Forced sexual activity: Not on file  Other Topics Concern  . Not on file  Social History Narrative  . Not on file   Current Outpatient Medications on File Prior to Visit  Medication Sig Dispense Refill  . acetaminophen (TYLENOL) 650 MG CR tablet Take 650 mg by mouth every 8 (eight) hours as needed for pain. Tylenol Arthritis two  tablets in the morning and two tablets in the evening    . amLODipine (NORVASC) 10 MG tablet Take 10 mg by mouth daily.    Marland Kitchen atorvastatin (LIPITOR) 40 MG tablet TAKE 1 TABLET BY MOUTH EVERY DAY 90  tablet 2  . levothyroxine (SYNTHROID, LEVOTHROID) 125 MCG tablet Take 1 tablet (125 mcg total) by mouth daily before breakfast. 90 tablet 3  . losartan (COZAAR) 100 MG tablet Take 1 tablet (100 mg total) by mouth daily. (Patient not taking: Reported on 07/25/2018) 90 tablet 2  . metFORMIN (GLUCOPHAGE-XR) 500 MG 24 hr tablet TAKE 1 TABLET BY MOUTH EVERY DAY WITH BREAKFAST 90 tablet 3  . Misc Natural Products (TART CHERRY ADVANCED) CAPS Take by mouth daily.    Marland Kitchen  Multiple Vitamins-Minerals (CENTRUM SILVER PO) Take by mouth daily.     Marland Kitchen UNABLE TO FIND Med Name: tumeric po qd     No current facility-administered medications on file prior to visit.    No Known Allergies   Review of systems: Constitutional:  She has no weight gain or weight loss. She has no fever or chills. Eyes: No blurred vision Ears, Nose, Mouth, Throat: No dizziness, headaches or changes in hearing. No mouth sores. Cardiovascular: No chest pain, palpitations or edema. Respiratory:  No shortness of breath, wheezing or cough Gastrointestinal: She has normal bowel movements without diarrhea or constipation. She denies any nausea or vomiting. She denies blood in her stool or heart burn. Genitourinary:  She denies pelvic pain, pelvic pressure or changes in her urinary function. She has no hematuria, dysuria, or incontinence. No vaginal bleeding and vaginal discharge Musculoskeletal: + generalized arthritis pain Skin:  She has no skin changes, rashes or itching Neurological:  Denies dizziness or headaches. No neuropathy, no numbness or tingling. Psychiatric:  She denies depression or anxiety. Hematologic/Lymphatic:   No easy bruising or bleeding   Physical Exam: Blood pressure (!) 178/70, pulse 73, temperature 99.1 F (37.3 C), temperature source Oral, resp. rate 18, height 5\' 8"  (1.727 m), weight 284 lb 1.6 oz (128.9 kg), SpO2 96 %. General: Well dressed, well nourished in no apparent distress.   HEENT:  Normocephalic and  atraumatic, no lesions.  Extraocular muscles intact. Sclerae anicteric. Pupils equal, round, reactive. No mouth sores or ulcers. Thyroid is normal size, not nodular, midline. Abdomen:  Soft, nontender, nondistended.  No palpable masses.  No hepatosplenomegaly.  No ascites. Normal bowel sounds.  No hernias.  Incisions are well healed. Genitourinary: Normal EGBUS  Vaginal cuff intact.  No bleeding or discharge.  Limited exam due to long vagina and narrow upper vagina. No cul de sac fullness. Extremities: No cyanosis, clubbing or edema.  No calf tenderness or erythema. No palpable cords. Psychiatric: Mood and affect are appropriate. Neurological: Awake, alert and oriented x 3. Sensation is intact, no neuropathy.  Musculoskeletal: No pain, normal strength and range of motion.   Thereasa Solo, MD

## 2018-08-11 LAB — CA 125: Cancer Antigen (CA) 125: 4.8 U/mL (ref 0.0–38.1)

## 2018-08-14 ENCOUNTER — Telehealth: Payer: Self-pay

## 2018-08-14 NOTE — Telephone Encounter (Signed)
Told Ms Weikel that her CA-125 was WNL at 4.8 per Joylene John, NP.

## 2018-08-24 ENCOUNTER — Other Ambulatory Visit (HOSPITAL_COMMUNITY)
Admission: RE | Admit: 2018-08-24 | Discharge: 2018-08-24 | Disposition: A | Discharge status: Home / Self Care | Payer: Medicare Other | Source: Ambulatory Visit | Attending: Internal Medicine | Admitting: Internal Medicine

## 2018-08-24 ENCOUNTER — Other Ambulatory Visit: Payer: Self-pay

## 2018-08-24 DIAGNOSIS — Z01812 Encounter for preprocedural laboratory examination: Secondary | ICD-10-CM | POA: Insufficient documentation

## 2018-08-24 DIAGNOSIS — Z20828 Contact with and (suspected) exposure to other viral communicable diseases: Secondary | ICD-10-CM | POA: Diagnosis not present

## 2018-08-25 LAB — SARS CORONAVIRUS 2 (TAT 6-24 HRS): SARS Coronavirus 2: NEGATIVE

## 2018-08-27 ENCOUNTER — Encounter (HOSPITAL_COMMUNITY): Payer: Self-pay | Admitting: *Deleted

## 2018-08-27 ENCOUNTER — Other Ambulatory Visit: Payer: Self-pay

## 2018-08-27 ENCOUNTER — Encounter (HOSPITAL_COMMUNITY)
Admission: RE | Disposition: A | Discharge status: Home / Self Care | Payer: Self-pay | Source: Home / Self Care | Attending: Internal Medicine

## 2018-08-27 ENCOUNTER — Ambulatory Visit (HOSPITAL_COMMUNITY)
Admission: RE | Admit: 2018-08-27 | Discharge: 2018-08-27 | Disposition: A | Discharge status: Home / Self Care | Payer: Medicare Other | Attending: Internal Medicine | Admitting: Internal Medicine

## 2018-08-27 DIAGNOSIS — Z8542 Personal history of malignant neoplasm of other parts of uterus: Secondary | ICD-10-CM | POA: Insufficient documentation

## 2018-08-27 DIAGNOSIS — Z86010 Personal history of colonic polyps: Secondary | ICD-10-CM | POA: Diagnosis not present

## 2018-08-27 DIAGNOSIS — Z87891 Personal history of nicotine dependence: Secondary | ICD-10-CM | POA: Insufficient documentation

## 2018-08-27 DIAGNOSIS — K5730 Diverticulosis of large intestine without perforation or abscess without bleeding: Secondary | ICD-10-CM | POA: Diagnosis not present

## 2018-08-27 DIAGNOSIS — Z09 Encounter for follow-up examination after completed treatment for conditions other than malignant neoplasm: Secondary | ICD-10-CM | POA: Diagnosis not present

## 2018-08-27 DIAGNOSIS — K6289 Other specified diseases of anus and rectum: Secondary | ICD-10-CM | POA: Diagnosis not present

## 2018-08-27 DIAGNOSIS — Z7984 Long term (current) use of oral hypoglycemic drugs: Secondary | ICD-10-CM | POA: Diagnosis not present

## 2018-08-27 DIAGNOSIS — N3281 Overactive bladder: Secondary | ICD-10-CM | POA: Insufficient documentation

## 2018-08-27 DIAGNOSIS — I10 Essential (primary) hypertension: Secondary | ICD-10-CM | POA: Insufficient documentation

## 2018-08-27 DIAGNOSIS — E785 Hyperlipidemia, unspecified: Secondary | ICD-10-CM | POA: Diagnosis not present

## 2018-08-27 DIAGNOSIS — M1990 Unspecified osteoarthritis, unspecified site: Secondary | ICD-10-CM | POA: Insufficient documentation

## 2018-08-27 DIAGNOSIS — D123 Benign neoplasm of transverse colon: Secondary | ICD-10-CM | POA: Insufficient documentation

## 2018-08-27 DIAGNOSIS — E079 Disorder of thyroid, unspecified: Secondary | ICD-10-CM | POA: Insufficient documentation

## 2018-08-27 DIAGNOSIS — K648 Other hemorrhoids: Secondary | ICD-10-CM | POA: Diagnosis not present

## 2018-08-27 DIAGNOSIS — Z79899 Other long term (current) drug therapy: Secondary | ICD-10-CM | POA: Insufficient documentation

## 2018-08-27 DIAGNOSIS — Z800 Family history of malignant neoplasm of digestive organs: Secondary | ICD-10-CM | POA: Diagnosis not present

## 2018-08-27 DIAGNOSIS — E119 Type 2 diabetes mellitus without complications: Secondary | ICD-10-CM | POA: Insufficient documentation

## 2018-08-27 DIAGNOSIS — Z79890 Hormone replacement therapy (postmenopausal): Secondary | ICD-10-CM | POA: Insufficient documentation

## 2018-08-27 HISTORY — PX: COLONOSCOPY: SHX5424

## 2018-08-27 HISTORY — PX: POLYPECTOMY: SHX5525

## 2018-08-27 LAB — GLUCOSE, CAPILLARY: Glucose-Capillary: 145 mg/dL — ABNORMAL HIGH (ref 70–99)

## 2018-08-27 SURGERY — COLONOSCOPY
Anesthesia: Moderate Sedation

## 2018-08-27 MED ORDER — STERILE WATER FOR IRRIGATION IR SOLN
Status: DC | PRN
  Administered 2018-08-27: 2.5 mL

## 2018-08-27 MED ORDER — MIDAZOLAM HCL 5 MG/5ML IJ SOLN
INTRAMUSCULAR | Status: AC
  Filled 2018-08-27: qty 10

## 2018-08-27 MED ORDER — SODIUM CHLORIDE 0.9 % IV SOLN
INTRAVENOUS | Status: DC
  Administered 2018-08-27: 08:00:00 via INTRAVENOUS

## 2018-08-27 MED ORDER — MIDAZOLAM HCL 5 MG/5ML IJ SOLN
INTRAMUSCULAR | Status: AC
  Filled 2018-08-27: qty 5

## 2018-08-27 MED ORDER — MEPERIDINE HCL 50 MG/ML IJ SOLN
INTRAMUSCULAR | Status: DC | PRN
  Administered 2018-08-27 (×2): 25 mg via INTRAVENOUS

## 2018-08-27 MED ORDER — MIDAZOLAM HCL 5 MG/5ML IJ SOLN
INTRAMUSCULAR | Status: DC | PRN
  Administered 2018-08-27: 2 mg via INTRAVENOUS
  Administered 2018-08-27 (×3): 1 mg via INTRAVENOUS
  Administered 2018-08-27: 2 mg via INTRAVENOUS
  Administered 2018-08-27: 1 mg via INTRAVENOUS

## 2018-08-27 MED ORDER — MEPERIDINE HCL 50 MG/ML IJ SOLN
INTRAMUSCULAR | Status: AC
  Filled 2018-08-27: qty 1

## 2018-08-27 NOTE — Op Note (Signed)
[facility] Patient Name: Hannah Barrett Procedure Date: 08/27/2018 8:03 AM MRN: [id number] Date of Birth: 1950-04-25 Attending MD: Hildred Laser , MD CSN: [id number] Age: 68 Admit Type: Outpatient Procedure:                Colonoscopy Indications:              High risk colon cancer surveillance: Personal                            history of colonic polyps Providers:                Hildred Laser, MD, Otis Peak B. Sharon Seller, RN, Nelma Rothman, Technician Referring MD:             Manon Hilding, MD Medicines:                Meperidine 50 mg IV, Midazolam 8 mg IV Complications:            No immediate complications. Estimated Blood Loss:     Estimated blood loss was minimal. Procedure:                Pre-Anesthesia Assessment:                           - Prior to the procedure, a History and Physical                            was performed, and patient medications and                            allergies were reviewed. The patient's tolerance of                            previous anesthesia was also reviewed. The risks                            and benefits of the procedure and the sedation                            options and risks were discussed with the patient.                            All questions were answered, and informed consent                            was obtained. Prior Anticoagulants: The patient has                            taken no previous anticoagulant or antiplatelet                            agents. ASA Grade Assessment: III - A patient with  severe systemic disease. After reviewing the risks                            and benefits, the patient was deemed in                            satisfactory condition to undergo the procedure.                           After obtaining informed consent, the colonoscope                            was passed under direct vision. Throughout the      procedure, the patient's blood pressure, pulse, and                            oxygen saturations were monitored continuously. The                            [id number]-[id number] ([id number]) scope was introduced through                            the anus and advanced to the the cecum, identified                            by appendiceal orifice and ileocecal valve. The                            colonoscopy was technically difficult and complex                            due to significant looping. Successful completion                            of the procedure was aided by using manual pressure                            and withdrawing and reinserting the scope. The                            patient tolerated the procedure well. The quality                            of the bowel preparation was excellent. The                            ileocecal valve, appendiceal orifice, and rectum                            were photographed. Scope In: 8:31:55 AM Scope Out: 9:13:15 AM Scope Withdrawal Time: 0 hours 9 minutes 50 seconds  Total Procedure Duration: 0 hours 41 minutes 20 seconds  Findings:      The perianal and digital rectal examinations were normal.  A 5 mm polyp was found in the mid transverse colon. The polyp was       sessile. The polyp was removed with a cold snare. Resection and       retrieval were complete.      Scattered diverticula were found in the sigmoid colon.      Internal hemorrhoids were found during retroflexion. The hemorrhoids       were small.      Anal papilla(e) were hypertrophied. Impression:               - One 5 mm polyp in the mid transverse colon,                            removed with a cold snare. Resected and retrieved.                           - Diverticulosis in the sigmoid colon.                           - Internal hemorrhoids.                           - Anal papilla(e) were hypertrophied. Moderate Sedation:      Moderate (conscious) sedation  was administered by the endoscopy nurse       and supervised by the endoscopist. The following parameters were       monitored: oxygen saturation, heart rate, blood pressure, CO2       capnography and response to care. Total physician intraservice time was       46 minutes. Recommendation:           - Patient has a contact number available for                            emergencies. The signs and symptoms of potential                            delayed complications were discussed with the                            patient. Return to normal activities tomorrow.                            Written discharge instructions were provided to the                            patient.                           - High fiber diet and diabetic (ADA) diet today.                           - Continue present medications.                           - Await pathology results.                           -  Repeat colonoscopy in 5 years for surveillance. Procedure Code(s):        --- Professional ---                           [phone], Colonoscopy, flexible; with removal of                            tumor(s), polyp(s), or other lesion(s) by snare                            technique                           99153, Moderate sedation; each additional 15                            minutes intraservice time                           99153, Moderate sedation; each additional 15                            minutes intraservice time                           G0500, Moderate sedation services provided by the                            same physician or other qualified health care                            professional performing a gastrointestinal                            endoscopic service that sedation supports,                            requiring the presence of an independent trained                            observer to assist in the monitoring of the                            patient's level of consciousness  and physiological                            status; initial 15 minutes of intra-service time;                            patient age 38 years or older (additional time may                            be reported with [phone], as appropriate) Diagnosis Code(s):        --- Professional ---  Z86.010, Personal history of colonic polyps                           K63.5, Polyp of colon                           K64.8, Other hemorrhoids                           K62.89, Other specified diseases of anus and rectum                           K57.30, Diverticulosis of large intestine without                            perforation or abscess without bleeding CPT copyright 2019 American [employer]. All rights reserved. The codes documented in this report are preliminary and upon coder review may  be revised to meet current compliance requirements. Hildred Laser, MD Hildred Laser, MD 08/27/2018 9:30:19 AM This report has been signed electronically. Number of Addenda: 0

## 2018-08-27 NOTE — Discharge Instructions (Signed)
No aspirin or NSAIDs for 24 hours. Resume usual medications as before. Modified carb, high-fiber diet. No driving for 24 hours. Physician will call with biopsy results. No driving for 24 hours.  PATIENT INSTRUCTIONS POST-ANESTHESIA  IMMEDIATELY FOLLOWING SURGERY:  Do not drive or operate machinery for the first twenty four hours after surgery.  Do not make any important decisions for twenty four hours after surgery or while taking narcotic pain medications or sedatives.  If you develop intractable nausea and vomiting or a severe headache please notify your doctor immediately.  FOLLOW-UP:  Please make an appointment with your surgeon as instructed. You do not need to follow up with anesthesia unless specifically instructed to do so.  WOUND CARE INSTRUCTIONS (if applicable):  Keep a dry clean dressing on the anesthesia/puncture wound site if there is drainage.  Once the wound has quit draining you may leave it open to air.  Generally you should leave the bandage intact for twenty four hours unless there is drainage.  If the epidural site drains for more than 36-48 hours please call the anesthesia department.  QUESTIONS?:  Please feel free to call your physician or the hospital operator if you have any questions, and they will be happy to assist you.      Colonoscopy, Adult, Care After This sheet gives you information about how to care for yourself after your procedure. Your doctor may also give you more specific instructions. If you have problems or questions, call your doctor. What can I expect after the procedure? After the procedure, it is common to have:  A small amount of blood in your poop for 24 hours.  Some gas.  Mild cramping or bloating in your belly. Follow these instructions at home: General instructions  For the first 24 hours after the procedure: ? Do not drive or use machinery. ? Do not sign important documents. ? Do not drink alcohol. ? Do your daily activities more  slowly than normal. ? Eat foods that are soft and easy to digest.  Take over-the-counter or prescription medicines only as told by your doctor. To help cramping and bloating:   Try walking around.  Put heat on your belly (abdomen) as told by your doctor. Use a heat source that your doctor recommends, such as a moist heat pack or a heating pad. ? Put a towel between your skin and the heat source. ? Leave the heat on for 20-30 minutes. ? Remove the heat if your skin turns bright red. This is especially important if you cannot feel pain, heat, or cold. You can get burned. Eating and drinking   Drink enough fluid to keep your pee (urine) clear or pale yellow.  Return to your normal diet as told by your doctor. Avoid heavy or fried foods that are hard to digest.  Avoid drinking alcohol for as long as told by your doctor. Contact a doctor if:  You have blood in your poop (stool) 2-3 days after the procedure. Get help right away if:  You have more than a small amount of blood in your poop.  You see large clumps of tissue (blood clots) in your poop.  Your belly is swollen.  You feel sick to your stomach (nauseous).  You throw up (vomit).  You have a fever.  You have belly pain that gets worse, and medicine does not help your pain. Summary  After the procedure, it is common to have a small amount of blood in your poop. You may also  have mild cramping and bloating in your belly.  For the first 24 hours after the procedure, do not drive or use machinery, do not sign important documents, and do not drink alcohol.  Get help right away if you have a lot of blood in your poop, feel sick to your stomach, have a fever, or have more belly pain. This information is not intended to replace advice given to you by your health care provider. Make sure you discuss any questions you have with your health care provider. Document Released: 02/05/2010 Document Revised: 11/03/2016 Document  Reviewed: 09/28/2015 Elsevier Patient Education  2020 [employer].    Colon Polyps  Polyps are tissue growths inside the body. Polyps can grow in many places, including the large intestine (colon). A polyp may be a round bump or a mushroom-shaped growth. You could have one polyp or several. Most colon polyps are noncancerous (benign). However, some colon polyps can become cancerous over time. Finding and removing the polyps early can help prevent this. What are the causes? The exact cause of colon polyps is not known. What increases the risk? You are more likely to develop this condition if you:  Have a family history of colon cancer or colon polyps.  Are older than 30 or older than 45 if you are African American.  Have inflammatory bowel disease, such as ulcerative colitis or Crohn's disease.  Have certain hereditary conditions, such as: ? Familial adenomatous polyposis. ? Lynch syndrome. ? Turcot syndrome. ? Peutz-Jeghers syndrome.  Are overweight.  Smoke cigarettes.  Do not get enough exercise.  Drink too much alcohol.  Eat a diet that is high in fat and red meat and low in fiber.  Had childhood cancer that was treated with abdominal radiation. What are the signs or symptoms? Most polyps do not cause symptoms. If you have symptoms, they may include:  Blood coming from your rectum when having a bowel movement.  Blood in your stool. The stool may look dark red or black.  Abdominal pain.  A change in bowel habits, such as constipation or diarrhea. How is this diagnosed? This condition is diagnosed with a colonoscopy. This is a procedure in which a lighted, flexible scope is inserted into the anus and then passed into the colon to examine the area. Polyps are sometimes found when a colonoscopy is done as part of routine cancer screening tests. How is this treated? Treatment for this condition involves removing any polyps that are found. Most polyps can be removed  during a colonoscopy. Those polyps will then be tested for cancer. Additional treatment may be needed depending on the results of testing. Follow these instructions at home: Lifestyle  Maintain a healthy weight, or lose weight if recommended by your health care provider.  Exercise every day or as told by your health care provider.  Do not use any products that contain nicotine or tobacco, such as cigarettes and e-cigarettes. If you need help quitting, ask your health care provider.  If you drink alcohol, limit how much you have: ? 0-1 drink a day for women. ? 0-2 drinks a day for men.  Be aware of how much alcohol is in your drink. In the [place] one drink equals one 12 oz bottle of beer (355 mL), one 5 oz glass of wine (148 mL), or one 1 oz shot of hard liquor (44 mL). Eating and drinking   Eat foods that are high in fiber, such as fruits, vegetables, and whole grains.  Eat  foods that are high in calcium and vitamin D, such as milk, cheese, yogurt, eggs, liver, fish, and broccoli.  Limit foods that are high in fat, such as fried foods and desserts.  Limit the amount of red meat and processed meat you eat, such as hot dogs, sausage, bacon, and lunch meats. General instructions  Keep all follow-up visits as told by your health care provider. This is important. ? This includes having regularly scheduled colonoscopies. ? Talk to your health care provider about when you need a colonoscopy. Contact a health care provider if:  You have new or worsening bleeding during a bowel movement.  You have new or increased blood in your stool.  You have a change in bowel habits.  You lose weight for no known reason. Summary  Polyps are tissue growths inside the body. Polyps can grow in many places, including the colon.  Most colon polyps are noncancerous (benign), but some can become cancerous over time.  This condition is diagnosed with a colonoscopy.  Treatment for this condition  involves removing any polyps that are found. Most polyps can be removed during a colonoscopy. This information is not intended to replace advice given to you by your health care provider. Make sure you discuss any questions you have with your health care provider. Document Released: 09/30/2003 Document Revised: 04/20/2017 Document Reviewed: 04/20/2017 Elsevier Patient Education  2020 [employer].    Diverticulosis  Diverticulosis is a condition that develops when small pouches (diverticula) form in the wall of the large intestine (colon). The colon is where water is absorbed and stool is formed. The pouches form when the inside layer of the colon pushes through weak spots in the outer layers of the colon. You may have a few pouches or many of them. What are the causes? The cause of this condition is not known. What increases the risk? The following factors may make you more likely to develop this condition:  Being older than age 61. Your risk for this condition increases with age. Diverticulosis is rare among people younger than age 61. By age 42, many people have it.  Eating a low-fiber diet.  Having frequent constipation.  Being overweight.  Not getting enough exercise.  Smoking.  Taking over-the-counter pain medicines, like aspirin and ibuprofen.  Having a family history of diverticulosis. What are the signs or symptoms? In most people, there are no symptoms of this condition. If you do have symptoms, they may include:  Bloating.  Cramps in the abdomen.  Constipation or diarrhea.  Pain in the lower left side of the abdomen. How is this diagnosed? This condition is most often diagnosed during an exam for other colon problems. Because diverticulosis usually has no symptoms, it often cannot be diagnosed independently. This condition may be diagnosed by:  Using a flexible scope to examine the colon (colonoscopy).  Taking an X-ray of the colon after dye has been put into  the colon (barium enema).  Doing a CT scan. How is this treated? You may not need treatment for this condition if you have never developed an infection related to diverticulosis. If you have had an infection before, treatment may include:  Eating a high-fiber diet. This may include eating more fruits, vegetables, and grains.  Taking a fiber supplement.  Taking a live bacteria supplement (probiotic).  Taking medicine to relax your colon.  Taking antibiotic medicines. Follow these instructions at home:  Drink 6-8 glasses of water or more each day to prevent constipation.  Try not to strain when you have a bowel movement.  If you have had an infection before: ? Eat more fiber as directed by your health care provider or your diet and nutrition specialist (dietitian). ? Take a fiber supplement or probiotic, if your health care provider approves.  Take over-the-counter and prescription medicines only as told by your health care provider.  If you were prescribed an antibiotic, take it as told by your health care provider. Do not stop taking the antibiotic even if you start to feel better.  Keep all follow-up visits as told by your health care provider. This is important. Contact a health care provider if:  You have pain in your abdomen.  You have bloating.  You have cramps.  You have not had a bowel movement in 3 days. Get help right away if:  Your pain gets worse.  Your bloating becomes very bad.  You have a fever or chills, and your symptoms suddenly get worse.  You vomit.  You have bowel movements that are bloody or black.  You have bleeding from your rectum. Summary  Diverticulosis is a condition that develops when small pouches (diverticula) form in the wall of the large intestine (colon).  You may have a few pouches or many of them.  This condition is most often diagnosed during an exam for other colon problems.  If you have had an infection related to  diverticulosis, treatment may include increasing the fiber in your diet, taking supplements, or taking medicines. This information is not intended to replace advice given to you by your health care provider. Make sure you discuss any questions you have with your health care provider. Document Released: 10/01/2003 Document Revised: 12/16/2016 Document Reviewed: 11/23/2015 Elsevier Patient Education  2020 [employer].   Hemorrhoids Hemorrhoids are swollen veins that may develop:  In the butt (rectum). These are called internal hemorrhoids.  Around the opening of the butt (anus). These are called external hemorrhoids. Hemorrhoids can cause pain, itching, or bleeding. Most of the time, they do not cause serious problems. They usually get better with diet changes, lifestyle changes, and other home treatments. What are the causes? This condition may be caused by:  Having trouble pooping (constipation).  Pushing hard (straining) to poop.  Watery poop (diarrhea).  Pregnancy.  Being very overweight (obese).  Sitting for long periods of time.  Heavy lifting or other activity that causes you to strain.  Anal sex.  Riding a bike for a long period of time. What are the signs or symptoms? Symptoms of this condition include:  Pain.  Itching or soreness in the butt.  Bleeding from the butt.  Leaking poop.  Swelling in the area.  One or more lumps around the opening of your butt. How is this diagnosed? A doctor can often diagnose this condition by looking at the affected area. The doctor may also:  Do an exam that involves feeling the area with a gloved hand (digital rectal exam).  Examine the area inside your butt using a small tube (anoscope).  Order blood tests. This may be done if you have lost a lot of blood.  Have you get a test that involves looking inside the colon using a flexible tube with a camera on the end (sigmoidoscopy or colonoscopy). How is this  treated? This condition can usually be treated at home. Your doctor may tell you to change what you eat, make lifestyle changes, or try home treatments. If these do not help, procedures  can be done to remove the hemorrhoids or make them smaller. These may involve:  Placing rubber bands at the base of the hemorrhoids to cut off their blood supply.  Injecting medicine into the hemorrhoids to shrink them.  Shining a type of light energy onto the hemorrhoids to cause them to fall off.  Doing surgery to remove the hemorrhoids or cut off their blood supply. Follow these instructions at home: Eating and drinking   Eat foods that have a lot of fiber in them. These include whole grains, beans, nuts, fruits, and vegetables.  Ask your doctor about taking products that have added fiber (fibersupplements).  Reduce the amount of fat in your diet. You can do this by: ? Eating low-fat dairy products. ? Eating less red meat. ? Avoiding processed foods.  Drink enough fluid to keep your pee (urine) pale yellow. Managing pain and swelling   Take a warm-water bath (sitz bath) for 20 minutes to ease pain. Do this 3-4 times a day. You may do this in a bathtub or using a portable sitz bath that fits over the toilet.  If told, put ice on the painful area. It may be helpful to use ice between your warm baths. ? Put ice in a plastic bag. ? Place a towel between your skin and the bag. ? Leave the ice on for 20 minutes, 2-3 times a day. General instructions  Take over-the-counter and prescription medicines only as told by your doctor. ? Medicated creams and medicines may be used as told.  Exercise often. Ask your doctor how much and what kind of exercise is best for you.  Go to the bathroom when you have the urge to poop. Do not wait.  Avoid pushing too hard when you poop.  Keep your butt dry and clean. Use wet toilet paper or moist towelettes after pooping.  Do not sit on the toilet for a long  time.  Keep all follow-up visits as told by your doctor. This is important. Contact a doctor if you:  Have pain and swelling that do not get better with treatment or medicine.  Have trouble pooping.  Cannot poop.  Have pain or swelling outside the area of the hemorrhoids. Get help right away if you have:  Bleeding that will not stop. Summary  Hemorrhoids are swollen veins in the butt or around the opening of the butt.  They can cause pain, itching, or bleeding.  Eat foods that have a lot of fiber in them. These include whole grains, beans, nuts, fruits, and vegetables.  Take a warm-water bath (sitz bath) for 20 minutes to ease pain. Do this 3-4 times a day. This information is not intended to replace advice given to you by your health care provider. Make sure you discuss any questions you have with your health care provider. Document Released: 10/13/2007 Document Revised: 01/11/2018 Document Reviewed: 05/25/2017 Elsevier Patient Education  2020 [employer].

## 2018-08-27 NOTE — H&P (Signed)
Hannah Barrett is an 68 y.o. female.   Chief Complaint: Patient is here for colonoscopy HPI: Patient 68 year old Caucasian female who had over 10 mm sessile serrated polyp removed from according in March 2017.  She was therefore advised to return in 3 years.  She has no GI complaints.  She says as long as she is watching her diet she does not get constipated.  She denies abdominal pain or rectal bleeding. Family history is negative for CRC but her mother had colon carcinoma.  Past Medical History:  Diagnosis Date  . Arthritis   . Diabetes mellitus without complication ([place])   . History of endometrial cancer; stage 1A grade 1. 01/05/2016  . Hyperlipidemia   . Hypertension   . Overactive bladder   . PMB (postmenopausal bleeding) 11/18/2014  . Thyroid disease     Past Surgical History:  Procedure Laterality Date  . COLONOSCOPY    . COLONOSCOPY N/A 04/09/2015   Procedure: COLONOSCOPY;  Surgeon: Rogene Houston, MD;  [facility];  Service: Endoscopy;  Laterality: N/A;  1030  . CYSTOSCOPY    . POLYPECTOMY  04/09/2015   Procedure: POLYPECTOMY;  Surgeon: Rogene Houston, MD;  [facility];  Service: Endoscopy;;  Ascending colon polyp removed via hot snare  . ROBOTIC ASSISTED TOTAL HYSTERECTOMY WITH BILATERAL SALPINGO OOPHERECTOMY Bilateral 12/18/2014   Procedure: XI ROBOTIC ASSISTED TOTAL HYSTERECTOMY WITH BILATERAL SALPINGO OOPHORECTOMY;  Surgeon: Everitt Amber, MD;  [facility];  Service: Gynecology;  Laterality: Bilateral;    Family History  Problem Relation Age of Onset  . Congestive Heart Failure Mother   . Arthritis Father   . Other Father        killed in [place]  . Other Sister        brain injury after fall  . Cancer Sister        multiple mylemoma  . Obesity Daughter   . Diabetes Maternal Grandmother   . Parkinson's disease Paternal Grandfather    Social History:  reports that she quit smoking about 13 years ago. Her smoking use included cigarettes. She  started smoking about 50 years ago. She has a 72.00 pack-year smoking history. She has never used smokeless tobacco. She reports that she does not drink alcohol or use drugs.  Allergies: No Known Allergies  Medications Prior to Admission  Medication Sig Dispense Refill  . acetaminophen (TYLENOL) 500 MG tablet Take 1,000 mg by mouth 2 (two) times daily.    Marland Kitchen amLODipine (NORVASC) 10 MG tablet Take 10 mg by mouth daily.    Marland Kitchen atorvastatin (LIPITOR) 40 MG tablet TAKE 1 TABLET BY MOUTH EVERY DAY (Patient taking differently: Take 40 mg by mouth every evening. ) 90 tablet 2  . cholecalciferol (VITAMIN D3) 25 MCG (1000 UT) tablet Take 1,000 Units by mouth daily at 12 noon.    Marland Kitchen levothyroxine (SYNTHROID, LEVOTHROID) 125 MCG tablet Take 1 tablet (125 mcg total) by mouth daily before breakfast. 90 tablet 3  . losartan (COZAAR) 100 MG tablet Take 1 tablet (100 mg total) by mouth daily. (Patient taking differently: Take 100 mg by mouth every evening. ) 90 tablet 2  . metFORMIN (GLUCOPHAGE-XR) 500 MG 24 hr tablet TAKE 1 TABLET BY MOUTH EVERY DAY WITH BREAKFAST (Patient taking differently: Take 500 mg by mouth daily with breakfast. ) 90 tablet 3  . Misc Natural Products (TART CHERRY ADVANCED) CAPS Take 1 capsule by mouth daily as needed (joint pain).     Marland Kitchen  Multiple Vitamins-Minerals (CENTRUM SILVER PO) Take 1 tablet by mouth every evening.     . TURMERIC CURCUMIN PO Take 375 mg by mouth daily as needed (joint pain).      Results for orders placed or performed during the hospital encounter of 08/27/18 (from the past 48 hour(s))  Glucose, capillary     Status: Abnormal   Collection Time: 08/27/18  8:00 AM  Result Value Ref Range   Glucose-Capillary 145 (H) 70 - 99 mg/dL   No results found.  ROS  Blood pressure (!) 174/73, pulse 80, temperature 98.2 F (36.8 C), temperature source Oral, resp. rate 13, SpO2 97 %. Physical Exam  Constitutional: She appears well-developed and well-nourished.  HENT:   Mouth/Throat: Oropharynx is clear and moist.  Eyes: Conjunctivae are normal. No scleral icterus.  Neck: No thyromegaly present.  Cardiovascular: Normal rate and regular rhythm.  Murmur heard. Faint systolic murmur at left sternal border.  Respiratory: Effort normal and breath sounds normal.  GI:  Abdomen is full with laparoscopy scars and a small umbilical hernia.  On palpation is soft and nontender with organomegaly or masses.  Musculoskeletal:        General: No edema.  Lymphadenopathy:    She has no cervical adenopathy.  Neurological: She is alert.  Skin: Skin is warm and dry.     Assessment/Plan History of sessile serrated polyp. Surveillance colonoscopy.  Hildred Laser, MD 08/27/2018, 8:19 AM

## 2018-09-05 ENCOUNTER — Encounter (HOSPITAL_COMMUNITY): Payer: Self-pay | Admitting: Internal Medicine

## 2018-09-19 ENCOUNTER — Telehealth: Payer: Self-pay | Admitting: "Endocrinology

## 2018-09-19 NOTE — Telephone Encounter (Signed)
Patient called and said that she received a letter in the mail that her Metformin has been recalled. She would like something else called in.

## 2018-09-20 ENCOUNTER — Other Ambulatory Visit: Payer: Self-pay | Admitting: "Endocrinology

## 2018-09-20 MED ORDER — SITAGLIPTIN PHOSPHATE 50 MG PO TABS: 50.0000 mg | ORAL_TABLET | Freq: Every day | ORAL | 6 refills | Status: DC

## 2018-09-20 NOTE — Telephone Encounter (Signed)
Noted. Pt states she still has Metformin at home

## 2018-09-20 NOTE — Telephone Encounter (Signed)
Pt said the pharmacist told her that her metformin was not actually recalled. She said the [place] is $400 a month and wants to go back on Metformin. Thanks.

## 2018-09-20 NOTE — Telephone Encounter (Signed)
Pt.notified

## 2018-09-20 NOTE — Telephone Encounter (Signed)
She will get Januvia 50 mg po qday.

## 2018-10-17 ENCOUNTER — Telehealth: Payer: Self-pay | Admitting: Cardiology

## 2018-10-17 NOTE — Telephone Encounter (Signed)
Virtual Visit Pre-Appointment Phone Call  "(Name), I am calling you today to discuss your upcoming appointment. We are currently trying to limit exposure to the virus that causes COVID-19 by seeing patients at home rather than in the office."  1. "What is the BEST phone number to call the day of the visit?" - include this in appointment notes  2. Do you have or have access to (through a family member/friend) a smartphone with video capability that we can use for your visit?" a. If yes - list this number in appt notes as cell (if different from BEST phone #) and list the appointment type as a VIDEO visit in appointment notes b. If no - list the appointment type as a PHONE visit in appointment notes  Confirm consent - "In the setting of the current Covid19 crisis, you are scheduled for a (phone or video) visit with your provider on (date) at (time).  Just as we do with many in-office visits, in order for you to participate in this visit, we must obtain consent.  If you'd like, I can send this to your mychart (if signed up) or email for you to review.  Otherwise, I can obtain your verbal consent now.  All virtual visits are billed to your insurance company just like a normal visit would be.  By agreeing to a virtual visit, we'd like you to understand that the technology does not allow for your provider to perform an examination, and thus may limit your provider's ability to fully assess your condition. If your provider identifies any concerns that need to be evaluated in person, we will make arrangements to do so.  Finally, though the technology is pretty good, we cannot assure that it will always work on either your or our end, and in the setting of a video visit, we may have to convert it to a phone-only visit.  In either situation, we cannot ensure that we have a secure connection.  Are you willing to proceed?" STAFF: Did the patient verbally acknowledge consent to telehealth visit? Document  YES/NO here: yes 3. Advise patient to be prepared - "Two hours prior to your appointment, go ahead and check your blood pressure, pulse, oxygen saturation, and your weight (if you have the equipment to check those) and write them all down. When your visit starts, your provider will ask you for this information. If you have an Apple Watch or Kardia device, please plan to have heart rate information ready on the day of your appointment. Please have a pen and paper handy nearby the day of the visit as well."  4. Give patient instructions for MyChart download to smartphone OR Doximity/Doxy.me as below if video visit (depending on what platform provider is using)  5. Inform patient they will receive a phone call 15 minutes prior to their appointment time (may be from unknown caller ID) so they should be prepared to answer    TELEPHONE CALL NOTE  Hannah Barrett has been deemed a candidate for a follow-up tele-health visit to limit community exposure during the Covid-19 pandemic. I spoke with the patient via phone to ensure availability of phone/video source, confirm preferred email & phone number, and discuss instructions and expectations.  I reminded Hannah Barrett to be prepared with any vital sign and/or heart rhythm information that could potentially be obtained via home monitoring, at the time of her visit. I reminded Hannah Barrett to expect a phone call prior to her visit.  Hannah Barrett 10/17/2018 9:57 AM   INSTRUCTIONS FOR DOWNLOADING THE MYCHART APP TO SMARTPHONE  - The patient must first make sure to have activated MyChart and know their login information - If Apple, go to [employer] and type in MyChart in the search bar and download the app. If Android, ask patient to go to [employer] and type in [place] in the search bar and download the app. The app is free but as with any other app downloads, their phone may require them to verify saved payment information or  Apple/Android password.  - The patient will need to then log into the app with their MyChart username and password, and select [facility] as their healthcare provider to link the account. When it is time for your visit, go to the MyChart app, find appointments, and click Begin Video Visit. Be sure to Select Allow for your device to access the Microphone and Camera for your visit. You will then be connected, and your provider will be with you shortly.  **If they have any issues connecting, or need assistance please contact MyChart service desk [phone])**  **If using a computer, in order to ensure the best quality for their visit they will need to use either of the following Internet Browsers: [employer], or Google Chrome**  IF USING DOXIMITY or DOXY.ME - The patient will receive a link just prior to their visit by text.     FULL LENGTH CONSENT FOR TELE-HEALTH VISIT   I hereby voluntarily request, consent and authorize [facility] and its employed or contracted physicians, physician assistants, nurse practitioners or other licensed health care professionals (the Practitioner), to provide me with telemedicine health care services (the Services") as deemed necessary by the treating Practitioner. I acknowledge and consent to receive the Services by the Practitioner via telemedicine. I understand that the telemedicine visit will involve communicating with the Practitioner through live audiovisual communication technology and the disclosure of certain medical information by electronic transmission. I acknowledge that I have been given the opportunity to request an in-person assessment or other available alternative prior to the telemedicine visit and am voluntarily participating in the telemedicine visit.  I understand that I have the right to withhold or withdraw my consent to the use of telemedicine in the course of my care at any time, without affecting my right to future care  or treatment, and that the Practitioner or I may terminate the telemedicine visit at any time. I understand that I have the right to inspect all information obtained and/or recorded in the course of the telemedicine visit and may receive copies of available information for a reasonable fee.  I understand that some of the potential risks of receiving the Services via telemedicine include:   Delay or interruption in medical evaluation due to technological equipment failure or disruption;  Information transmitted may not be sufficient (e.g. poor resolution of images) to allow for appropriate medical decision making by the Practitioner; and/or   In rare instances, security protocols could fail, causing a breach of personal health information.  Furthermore, I acknowledge that it is my responsibility to provide information about my medical history, conditions and care that is complete and accurate to the best of my ability. I acknowledge that Practitioner's advice, recommendations, and/or decision may be based on factors not within their control, such as incomplete or inaccurate data provided by me or distortions of diagnostic images or specimens that may result from electronic transmissions. I understand that the  practice of medicine is not an Chief Strategy Officer and that Practitioner makes no warranties or guarantees regarding treatment outcomes. I acknowledge that I will receive a copy of this consent concurrently upon execution via email to the email address I last provided but may also request a printed copy by calling the office of [facility].    I understand that my insurance will be billed for this visit.   I have read or had this consent read to me.  I understand the contents of this consent, which adequately explains the benefits and risks of the Services being provided via telemedicine.   I have been provided ample opportunity to ask questions regarding this consent and the Services and have had  my questions answered to my satisfaction.  I give my informed consent for the services to be provided through the use of telemedicine in my medical care  By participating in this telemedicine visit I agree to the above.

## 2018-11-20 DIAGNOSIS — R002 Palpitations: Secondary | ICD-10-CM | POA: Diagnosis not present

## 2018-11-20 DIAGNOSIS — E039 Hypothyroidism, unspecified: Secondary | ICD-10-CM | POA: Diagnosis not present

## 2018-11-20 DIAGNOSIS — I10 Essential (primary) hypertension: Secondary | ICD-10-CM | POA: Diagnosis not present

## 2018-11-20 DIAGNOSIS — E119 Type 2 diabetes mellitus without complications: Secondary | ICD-10-CM | POA: Diagnosis not present

## 2018-11-20 DIAGNOSIS — R5382 Chronic fatigue, unspecified: Secondary | ICD-10-CM | POA: Diagnosis not present

## 2018-11-20 DIAGNOSIS — E782 Mixed hyperlipidemia: Secondary | ICD-10-CM | POA: Diagnosis not present

## 2018-11-20 DIAGNOSIS — K219 Gastro-esophageal reflux disease without esophagitis: Secondary | ICD-10-CM | POA: Diagnosis not present

## 2018-11-21 ENCOUNTER — Other Ambulatory Visit: Payer: Self-pay | Admitting: Cardiology

## 2018-11-23 DIAGNOSIS — M1991 Primary osteoarthritis, unspecified site: Secondary | ICD-10-CM | POA: Diagnosis not present

## 2018-11-23 DIAGNOSIS — E039 Hypothyroidism, unspecified: Secondary | ICD-10-CM | POA: Diagnosis not present

## 2018-11-23 DIAGNOSIS — R202 Paresthesia of skin: Secondary | ICD-10-CM | POA: Diagnosis not present

## 2018-11-23 DIAGNOSIS — G4733 Obstructive sleep apnea (adult) (pediatric): Secondary | ICD-10-CM | POA: Diagnosis not present

## 2018-11-23 DIAGNOSIS — Z23 Encounter for immunization: Secondary | ICD-10-CM | POA: Diagnosis not present

## 2018-11-23 DIAGNOSIS — E782 Mixed hyperlipidemia: Secondary | ICD-10-CM | POA: Diagnosis not present

## 2018-11-23 DIAGNOSIS — Z6841 Body Mass Index (BMI) 40.0 and over, adult: Secondary | ICD-10-CM | POA: Diagnosis not present

## 2018-11-23 DIAGNOSIS — I10 Essential (primary) hypertension: Secondary | ICD-10-CM | POA: Diagnosis not present

## 2018-12-04 ENCOUNTER — Telehealth: Payer: Self-pay | Admitting: "Endocrinology

## 2018-12-04 NOTE — Telephone Encounter (Signed)
Pt  States that Januvia will cost $412. Pt is requesting to go back on MTF. She still has 2 refills on MTF if she can take this.

## 2018-12-04 NOTE — Telephone Encounter (Signed)
Yes , she can resume MTF 500mg  ER twice a day.

## 2018-12-04 NOTE — Telephone Encounter (Signed)
Pt.notified

## 2018-12-24 ENCOUNTER — Encounter: Payer: Self-pay | Admitting: Cardiology

## 2018-12-24 ENCOUNTER — Telehealth (INDEPENDENT_AMBULATORY_CARE_PROVIDER_SITE_OTHER): Payer: Medicare Other | Admitting: Cardiology

## 2018-12-24 VITALS — BP 159/82 | HR 77 | Ht 68.0 in | Wt 277.8 lb

## 2018-12-24 DIAGNOSIS — E782 Mixed hyperlipidemia: Secondary | ICD-10-CM

## 2018-12-24 DIAGNOSIS — R002 Palpitations: Secondary | ICD-10-CM

## 2018-12-24 DIAGNOSIS — E119 Type 2 diabetes mellitus without complications: Secondary | ICD-10-CM

## 2018-12-24 DIAGNOSIS — I10 Essential (primary) hypertension: Secondary | ICD-10-CM

## 2018-12-24 NOTE — Patient Instructions (Signed)
Medication Instructions:   Restart the Losartan 100mg  daily.   Continue all other medications.    Labwork: none  Testing/Procedures: none  Follow-Up: 1 year   Any Other Special Instructions Will Be Listed Below (If Applicable). Your physician has requested that you regularly monitor and record your blood pressure readings at home x 1 week. Please use the same machine at the same time of day to check your readings.  You may call the readings into the office or drop off for MD review.   If you need a refill on your cardiac medications before your next appointment, please call your pharmacy.

## 2018-12-24 NOTE — Progress Notes (Signed)
Virtual Visit via Telephone Note   This visit type was conducted due to national recommendations for restrictions regarding the COVID-19 Pandemic (e.g. social distancing) in an effort to limit this patient's exposure and mitigate transmission in our community.  Due to her co-morbid illnesses, this patient is at least at moderate risk for complications without adequate follow up.  This format is felt to be most appropriate for this patient at this time.  The patient did not have access to video technology/had technical difficulties with video requiring transitioning to audio format only (telephone).  All issues noted in this document were discussed and addressed.  No physical exam could be performed with this format.  Please refer to the patient's chart for her  consent to telehealth for [facility].   Date:  12/24/2018   ID:  Hannah Barrett, DOB [birth year], MRN [id number]  Patient Location: Home Provider Location: Office  PCP:  Manon Hilding, MD  Cardiologist:  Carlyle Dolly, MD  Electrophysiologist:  None   Evaluation Performed:  Follow-Up Visit  Chief Complaint:  Follow up visit  History of Present Illness:    Hannah Barrett is a 68 y.o. female seen today for follow up of the following medical problems.  1. Palpitations - 03/2016 event monitor without arrhytmias   - no recent palpitations  2. HTN - cough on ACE-I. Changed to ARB by pcp. - she stopped taking her losartan a few weeks ago, was confused whether she was supposed to be on   3. OSA -she is compliant with CPAP, has not been followed by a physician in sevearl years -    4. DM2  - followed by pcp    SH: husband followed a [facility], being considered for kidney transplant.   The patient does not have symptoms concerning for COVID-19 infection (fever, chills, cough, or new shortness of breath).    Past Medical History:  Diagnosis Date  . Arthritis   . Diabetes mellitus without  complication ([place])   . History of endometrial cancer 01/05/2016  . Hyperlipidemia   . Hypertension   . Overactive bladder   . PMB (postmenopausal bleeding) 11/18/2014  . Thyroid disease    Past Surgical History:  Procedure Laterality Date  . COLONOSCOPY    . COLONOSCOPY N/A 04/09/2015   Procedure: COLONOSCOPY;  Surgeon: Rogene Houston, MD;  [facility];  Service: Endoscopy;  Laterality: N/A;  1030  . COLONOSCOPY N/A 08/27/2018   Procedure: COLONOSCOPY;  Surgeon: Rogene Houston, MD;  [facility];  Service: Endoscopy;  Laterality: N/A;  8:30  . CYSTOSCOPY    . POLYPECTOMY  04/09/2015   Procedure: POLYPECTOMY;  Surgeon: Rogene Houston, MD;  [facility];  Service: Endoscopy;;  Ascending colon polyp removed via hot snare  . POLYPECTOMY  08/27/2018   Procedure: POLYPECTOMY;  Surgeon: Rogene Houston, MD;  [facility];  Service: Endoscopy;;  colon  . ROBOTIC ASSISTED TOTAL HYSTERECTOMY WITH BILATERAL SALPINGO OOPHERECTOMY Bilateral 12/18/2014   Procedure: XI ROBOTIC ASSISTED TOTAL HYSTERECTOMY WITH BILATERAL SALPINGO OOPHORECTOMY;  Surgeon: Everitt Amber, MD;  [facility];  Service: Gynecology;  Laterality: Bilateral;     No outpatient medications have been marked as taking for the 12/24/18 encounter (Appointment) with Arnoldo Lenis, MD.     Allergies:   Patient has no known allergies.   Social History   Tobacco Use  . Smoking status: Former Smoker    Packs/day: 2.00  Years: 36.00    Pack years: 72.00    Types: Cigarettes    Start date: 03/03/1968    Quit date: 10/27/2004    Years since quitting: 14.1  . Smokeless tobacco: Never Used  Substance Use Topics  . Alcohol use: No  . Drug use: No     Family Hx: The patient's family history includes Arthritis in her father; Cancer in her sister; Congestive Heart Failure in her mother; Diabetes in her maternal grandmother; Obesity in her daughter; Other in her father and sister;  Parkinson's disease in her paternal grandfather.  ROS:   Please see the history of present illness.     All other systems reviewed and are negative.   Prior CV studies:   The following studies were reviewed today:  03/2016 echo Study Conclusions  - Left ventricle: The cavity size was normal. Wall thickness was normal. Systolic function was normal. The estimated ejection fraction was in the range of 60% to 65%. Wall motion was normal; there were no regional wall motion abnormalities. Doppler parameters are consistent with abnormal left ventricular relaxation (grade 1 diastolic dysfunction).   03/2016 Cardiac monitor  Telemetry tracings show normal sinus rhythm  No symptoms reported  No significant arrhythmias   Labs/Other Tests and Data Reviewed:    EKG:  No ECG reviewed.  Recent Labs: 03/19/2018: BUN 17; Creatinine 0.7   Recent Lipid Panel Lab Results  Component Value Date/Time   CHOL 119 03/19/2018   TRIG 99 03/19/2018   HDL 41 03/19/2018   LDLCALC 58 03/19/2018    Wt Readings from Last 3 Encounters:  08/10/18 284 lb 1.6 oz (128.9 kg)  07/25/18 283 lb 12.8 oz (128.7 kg)  03/29/18 280 lb (127 kg)     Objective:    Vital Signs:   Today's Vitals   12/24/18 0951  BP: (!) 159/82  Pulse: 77  Weight: 277 lb 12.8 oz (126 kg)  Height: 5\' 8"  (1.727 m)   Body mass index is 42.24 kg/m. Normal affect. Normal speech pattern and tone. Comfortable, no apparent distress. No audible signs of SOB or wheezing.   ASSESSMENT & PLAN:    1. Palpitations -benign cardiac monitor - no recent symptoms, continue to monitor.   2. HTN - elevated but mistakingly has stopped taking her losartan - restart losartan 100mg  daily, she will call us with bp home numbers in 1 week  3. Hyperlipidemia - continue statin, requset labs from pcp  4. OSA - compliant with cpap, not followed by a physician, has not had her cpap evaluated in approx 8 years. I have  recommended establishing her with Dr Fransico Him, patient will let me know if she is ok to set up appointment.   5. DM2 - from cardiovascular standpoint continue statin and ARB.   COVID-19 Education: The signs and symptoms of COVID-19 were discussed with the patient and how to seek care for testing (follow up with PCP or arrange E-visit).  The importance of social distancing was discussed today.  Time:   Today, I have spent 21 minutes with the patient with telehealth technology discussing the above problems.     Medication Adjustments/Labs and Tests Ordered: Current medicines are reviewed at length with the patient today.  Concerns regarding medicines are outlined above.   Tests Ordered: No orders of the defined types were placed in this encounter.   Medication Changes: No orders of the defined types were placed in this encounter.   Follow Up:  In Person in  1 year(s)  Signed, Carlyle Dolly, MD  12/24/2018 8:50 AM     [facility]

## 2018-12-26 ENCOUNTER — Telehealth: Payer: Self-pay | Admitting: Cardiology

## 2018-12-26 NOTE — Telephone Encounter (Signed)
Patient needs instructions on which medication to take and how often. Has three medications she is unsure how to take

## 2018-12-27 NOTE — Telephone Encounter (Signed)
Pt wanted to clarify losartan 100 mg atorvastatin 40 mg and amlodipine 10 mg (pcp prescribed) and also wanted to clarify that she could take losartan in the AM and amlodipine and atorvastatin in the PM - aware that this was ok.

## 2018-12-31 ENCOUNTER — Telehealth: Payer: Self-pay | Admitting: *Deleted

## 2018-12-31 DIAGNOSIS — I10 Essential (primary) hypertension: Secondary | ICD-10-CM

## 2018-12-31 MED ORDER — CHLORTHALIDONE 25 MG PO TABS: 12.5000 mg | ORAL_TABLET | Freq: Every day | ORAL | 6 refills | Status: DC

## 2018-12-31 NOTE — Telephone Encounter (Signed)
Patient notified and verbalized understanding.  Will mail lab orders for her to do at [place] across from [facility].  New prescription sent to [facility] now.

## 2018-12-31 NOTE — Telephone Encounter (Signed)
Bp's too high, verify she is taking norvasc 10 mg and losartan 100mg . If so start chlorthalidone 12.5mg  daily, bmet and Mg in 2 weeks. Update [place] on bp's in 2 weeks, checking just 3-4 times a week is fine.    Zandra Abts MD

## 2018-12-31 NOTE — Telephone Encounter (Signed)
Patient calling to report BP readings -   12/7 - 159/82  77 12/8 - 170/89  75 12/9 - 165/84  82 12/12 - 175/86  77 12/13 - 161/89  83 12/14 - 163/85  75

## 2019-01-14 ENCOUNTER — Other Ambulatory Visit: Payer: Self-pay | Admitting: Cardiology

## 2019-01-14 LAB — BASIC METABOLIC PANEL
BUN: 17 mg/dL (ref 7–25)
CO2: 29 mmol/L (ref 20–32)
Calcium: 9.6 mg/dL (ref 8.6–10.4)
Chloride: 103 mmol/L (ref 98–110)
Creat: 0.66 mg/dL (ref 0.50–0.99)
Glucose, Bld: 131 mg/dL (ref 65–139)
Potassium: 3.7 mmol/L (ref 3.5–5.3)
Sodium: 142 mmol/L (ref 135–146)

## 2019-01-14 LAB — MAGNESIUM: Magnesium: 1.9 mg/dL (ref 1.5–2.5)

## 2019-01-14 MED ORDER — ATORVASTATIN CALCIUM 40 MG PO TABS: 40.0000 mg | ORAL_TABLET | Freq: Every day | ORAL | 3 refills | Status: AC

## 2019-01-14 NOTE — Telephone Encounter (Signed)
Done

## 2019-01-14 NOTE — Telephone Encounter (Signed)
Received call from [place] - [phone] [facility]) requesting a refill for atorvastatin (LIPITOR) 40 MG tablet  Patient is switching pharmacies to [facility] [address]

## 2019-01-15 LAB — HM DIABETES EYE EXAM

## 2019-01-21 ENCOUNTER — Telehealth: Payer: Self-pay | Admitting: *Deleted

## 2019-01-21 NOTE — Telephone Encounter (Signed)
Pt aware - routed to pcp  

## 2019-01-21 NOTE — Telephone Encounter (Signed)
-----   Message from Arnoldo Lenis, MD sent at 01/17/2019  9:10 AM EST ----- Normal labs

## 2019-02-15 DIAGNOSIS — E7849 Other hyperlipidemia: Secondary | ICD-10-CM | POA: Diagnosis not present

## 2019-02-15 DIAGNOSIS — I10 Essential (primary) hypertension: Secondary | ICD-10-CM | POA: Diagnosis not present

## 2019-03-04 ENCOUNTER — Other Ambulatory Visit: Payer: Self-pay | Admitting: Cardiology

## 2019-03-04 MED ORDER — LOSARTAN POTASSIUM 100 MG PO TABS: 100.0000 mg | ORAL_TABLET | Freq: Every day | ORAL | 1 refills | Status: DC

## 2019-03-04 NOTE — Telephone Encounter (Signed)
*  STAT* If patient is at the pharmacy, call can be transferred to refill team.   1. Which medications need to be refilled? losartan (COZAAR) 100 MG tablet   2. Which pharmacy/location (including street and city if local pharmacy) is medication to be sent to? [facility] [place]  3. Do they need a 30 day or 90 day supply?

## 2019-03-04 NOTE — Telephone Encounter (Signed)
Done

## 2019-03-15 DIAGNOSIS — Z23 Encounter for immunization: Secondary | ICD-10-CM | POA: Diagnosis not present

## 2019-03-15 DIAGNOSIS — I10 Essential (primary) hypertension: Secondary | ICD-10-CM | POA: Diagnosis not present

## 2019-03-15 DIAGNOSIS — E039 Hypothyroidism, unspecified: Secondary | ICD-10-CM | POA: Diagnosis not present

## 2019-03-19 DIAGNOSIS — K219 Gastro-esophageal reflux disease without esophagitis: Secondary | ICD-10-CM | POA: Diagnosis not present

## 2019-03-19 DIAGNOSIS — E782 Mixed hyperlipidemia: Secondary | ICD-10-CM | POA: Diagnosis not present

## 2019-03-19 DIAGNOSIS — I10 Essential (primary) hypertension: Secondary | ICD-10-CM | POA: Diagnosis not present

## 2019-03-19 DIAGNOSIS — E119 Type 2 diabetes mellitus without complications: Secondary | ICD-10-CM | POA: Diagnosis not present

## 2019-03-22 DIAGNOSIS — I10 Essential (primary) hypertension: Secondary | ICD-10-CM | POA: Diagnosis not present

## 2019-03-22 DIAGNOSIS — Z6841 Body Mass Index (BMI) 40.0 and over, adult: Secondary | ICD-10-CM | POA: Diagnosis not present

## 2019-03-22 DIAGNOSIS — G4733 Obstructive sleep apnea (adult) (pediatric): Secondary | ICD-10-CM | POA: Diagnosis not present

## 2019-03-22 DIAGNOSIS — E039 Hypothyroidism, unspecified: Secondary | ICD-10-CM | POA: Diagnosis not present

## 2019-03-22 DIAGNOSIS — R202 Paresthesia of skin: Secondary | ICD-10-CM | POA: Diagnosis not present

## 2019-03-22 DIAGNOSIS — R350 Frequency of micturition: Secondary | ICD-10-CM | POA: Diagnosis not present

## 2019-03-22 DIAGNOSIS — E782 Mixed hyperlipidemia: Secondary | ICD-10-CM | POA: Diagnosis not present

## 2019-03-22 DIAGNOSIS — R5382 Chronic fatigue, unspecified: Secondary | ICD-10-CM | POA: Diagnosis not present

## 2019-03-25 DIAGNOSIS — E038 Other specified hypothyroidism: Secondary | ICD-10-CM | POA: Diagnosis not present

## 2019-03-25 DIAGNOSIS — E118 Type 2 diabetes mellitus with unspecified complications: Secondary | ICD-10-CM | POA: Diagnosis not present

## 2019-03-25 LAB — BASIC METABOLIC PANEL
BUN: 13 (ref 4–21)
Creatinine: 0.5 (ref 0.5–1.1)

## 2019-03-25 LAB — TSH: TSH: 1.59 (ref 0.41–5.90)

## 2019-03-25 LAB — VITAMIN D 25 HYDROXY (VIT D DEFICIENCY, FRACTURES): Vit D, 25-Hydroxy: 45.6

## 2019-03-25 LAB — HEMOGLOBIN A1C: Hemoglobin A1C: 7.2

## 2019-04-01 ENCOUNTER — Ambulatory Visit (INDEPENDENT_AMBULATORY_CARE_PROVIDER_SITE_OTHER): Payer: Medicare Other | Admitting: "Endocrinology

## 2019-04-01 ENCOUNTER — Encounter: Payer: Self-pay | Admitting: "Endocrinology

## 2019-04-01 DIAGNOSIS — E782 Mixed hyperlipidemia: Secondary | ICD-10-CM | POA: Diagnosis not present

## 2019-04-01 DIAGNOSIS — E118 Type 2 diabetes mellitus with unspecified complications: Secondary | ICD-10-CM | POA: Diagnosis not present

## 2019-04-01 DIAGNOSIS — I10 Essential (primary) hypertension: Secondary | ICD-10-CM

## 2019-04-01 DIAGNOSIS — Z87891 Personal history of nicotine dependence: Secondary | ICD-10-CM

## 2019-04-01 DIAGNOSIS — E038 Other specified hypothyroidism: Secondary | ICD-10-CM

## 2019-04-01 MED ORDER — METFORMIN HCL 500 MG PO TABS: 500.0000 mg | ORAL_TABLET | Freq: Every day | ORAL | 1 refills | Status: DC

## 2019-04-01 NOTE — Patient Instructions (Signed)

## 2019-04-01 NOTE — Progress Notes (Signed)
04/01/2019                                 Endocrinology Telehealth Visit Follow up Note -During COVID -19 Pandemic  I connected with Hannah Barrett on 04/01/2019   by telephone and verified that I am speaking with the correct person using two identifiers. Hannah Barrett, 26-Aug-1950. she has verbally consented to this visit. All issues noted in this document were discussed and addressed. The format was not optimal for physical exam.   Subjective:    Patient ID: Hannah Barrett, female    DOB: 03-May-1950, PCP Sasser, Silvestre Moment, MD   Past Medical History:  Diagnosis Date  . Arthritis   . Diabetes mellitus without complication ([place])   . History of endometrial cancer 01/05/2016  . Hyperlipidemia   . Hypertension   . Overactive bladder   . PMB (postmenopausal bleeding) 11/18/2014  . Thyroid disease    Past Surgical History:  Procedure Laterality Date  . COLONOSCOPY    . COLONOSCOPY N/A 04/09/2015   Procedure: COLONOSCOPY;  Surgeon: Rogene Houston, MD;  [facility];  Service: Endoscopy;  Laterality: N/A;  1030  . COLONOSCOPY N/A 08/27/2018   Procedure: COLONOSCOPY;  Surgeon: Rogene Houston, MD;  [facility];  Service: Endoscopy;  Laterality: N/A;  8:30  . CYSTOSCOPY    . POLYPECTOMY  04/09/2015   Procedure: POLYPECTOMY;  Surgeon: Rogene Houston, MD;  [facility];  Service: Endoscopy;;  Ascending colon polyp removed via hot snare  . POLYPECTOMY  08/27/2018   Procedure: POLYPECTOMY;  Surgeon: Rogene Houston, MD;  [facility];  Service: Endoscopy;;  colon  . ROBOTIC ASSISTED TOTAL HYSTERECTOMY WITH BILATERAL SALPINGO OOPHERECTOMY Bilateral 12/18/2014   Procedure: XI ROBOTIC ASSISTED TOTAL HYSTERECTOMY WITH BILATERAL SALPINGO OOPHORECTOMY;  Surgeon: Everitt Amber, MD;  Location: [facility];  Service: Gynecology;  Laterality: Bilateral;   Social History   Socioeconomic History  . Marital status: Married    Spouse name: Not on file  . Number of  children: Not on file  . Years of education: Not on file  . Highest education level: Not on file  Occupational History  . Not on file  Tobacco Use  . Smoking status: Former Smoker    Packs/day: 2.00    Years: 36.00    Pack years: 72.00    Types: Cigarettes    Start date: 03/03/1968    Quit date: 10/27/2004    Years since quitting: 14.4  . Smokeless tobacco: Never Used  Substance and Sexual Activity  . Alcohol use: No  . Drug use: No  . Sexual activity: Not Currently    Birth control/protection: Post-menopausal, Surgical    Comment: hyst  Other Topics Concern  . Not on file  Social History Narrative  . Not on file   Social Determinants of Health   Financial Resource Strain:   . Difficulty of Paying Living Expenses:   Food Insecurity:   . Worried About Charity fundraiser in the Last Year:   . Arboriculturist in the Last Year:   Transportation Needs:   . Film/video editor (Medical):   Marland Kitchen Lack of Transportation (Non-Medical):   Physical Activity:   . Days of Exercise per Week:   . Minutes of Exercise per Session:   Stress:   . Feeling of Stress :   Social Connections:   .  Frequency of Communication with Friends and Family:   . Frequency of Social Gatherings with Friends and Family:   . Attends Religious Services:   . Active Member of Clubs or Organizations:   . Attends Archivist Meetings:   Marland Kitchen Marital Status:    Outpatient Encounter Medications as of 04/01/2019  Medication Sig  . acetaminophen (TYLENOL) 500 MG tablet Take 1,000 mg by mouth 2 (two) times daily.  Marland Kitchen amLODipine (NORVASC) 10 MG tablet Take 10 mg by mouth daily.  Marland Kitchen atorvastatin (LIPITOR) 40 MG tablet Take 1 tablet (40 mg total) by mouth daily.  . chlorthalidone (HYGROTON) 25 MG tablet Take 0.5 tablets (12.5 mg total) by mouth daily.  . cholecalciferol (VITAMIN D3) 25 MCG (1000 UT) tablet Take 1,000 Units by mouth daily at 12 noon.  Marland Kitchen levothyroxine (SYNTHROID, LEVOTHROID) 125 MCG tablet Take  1 tablet (125 mcg total) by mouth daily before breakfast.  . losartan (COZAAR) 100 MG tablet Take 1 tablet (100 mg total) by mouth daily.  . metFORMIN (GLUCOPHAGE) 500 MG tablet Take 1 tablet (500 mg total) by mouth daily with breakfast.  . Multiple Vitamins-Minerals (CENTRUM SILVER PO) Take 1 tablet by mouth every evening.   . [DISCONTINUED] metFORMIN (GLUCOPHAGE) 500 MG tablet Take 500 mg by mouth daily with breakfast.   No facility-administered encounter medications on file as of 04/01/2019.   ALLERGIES: No Known Allergies VACCINATION STATUS:  There is no immunization history on file for this patient.  69 yr old with hx of RAI induced hypothyroidism, and controlled type 2 DM .  She is being engaged in telehealth via telephone for follow-up of her hypothyroidism-RAI induced, here for follow-up for RAI induced hypothyroidism, as well as type 2 diabetes.  Is currently on levothyroxine 125 mcg p.o. daily before breakfast.   She is compliant, has no new complaints.  She denies heat intolerance, palpitations, and tremors.  She has a steady weight since last visit. -Her previsit labs show A1c of 7.2%, slightly increased from 6.7%.  She is tolerating her Metformin 500 mg ER daily after breakfast.    Review of Systems Limited as above.  Objective:    There were no vitals taken for this visit.  Wt Readings from Last 3 Encounters:  12/24/18 277 lb 12.8 oz (126 kg)  08/10/18 284 lb 1.6 oz (128.9 kg)  07/25/18 283 lb 12.8 oz (128.7 kg)     Results for orders placed or performed in visit on 04/01/19  VITAMIN D 25 Hydroxy (Vit-D Deficiency, Fractures)  Result Value Ref Range   Vit D, 25-Hydroxy 22.0   Basic metabolic panel  Result Value Ref Range   BUN 13 4 - 21   Creatinine 0.5 0.5 - 1.1  Hemoglobin A1c  Result Value Ref Range   Hemoglobin A1C 7.2   TSH  Result Value Ref Range   TSH 1.59 0.41 - 5.90   Complete Blood Count (Most recent): Lab Results  Component Value Date   WBC  12.6 (H) 12/19/2014   HGB 11.1 (L) 12/19/2014   HCT 34.8 (L) 12/19/2014   MCV 93.5 12/19/2014   PLT 260 12/19/2014   Chemistry (most recent): Lab Results  Component Value Date   NA 142 01/14/2019   K 3.7 01/14/2019   CL 103 01/14/2019   CO2 29 01/14/2019   BUN 13 03/25/2019   CREATININE 0.5 03/25/2019   Diabetic Labs (most recent): Lab Results  Component Value Date   HGBA1C 7.2 03/25/2019   HGBA1C 6.7 03/19/2018  HGBA1C 6.5 09/20/2017      Assessment & Plan:   1. RAI  hypothyroidism:  -Her thyroid function tests are consistent with appropriate replacement.  She is advised to continue levothyroxine 125 mcg p.o. daily before breakfast.    - We discussed about the correct intake of her thyroid hormone, on empty stomach at fasting, with water, separated by at least 30 minutes from breakfast and other medications,  and separated by more than 4 hours from calcium, iron, multivitamins, acid reflux medications (PPIs). -Patient is made aware of the fact that thyroid hormone replacement is needed for life, dose to be adjusted by periodic monitoring of thyroid function tests.   2. Type 2 diabetes mellitus with complication - Her recent A1c is 7.2% slightly increasing from 6.7%. -She is tolerating her metformin.  She is advised to continue Metformin 500 mg extended release daily after breakfast.     she is following with Jearld Fenton, CDE for DM education.  - she  admits there is a room for improvement in her diet and drink choices. -  Suggestion is made for her to avoid simple carbohydrates  from her diet including Cakes, Sweet Desserts / Pastries, Ice Cream, Soda (diet and regular), Sweet Tea, Candies, Chips, Cookies, Sweet Pastries,  Store Bought Juices, Alcohol in Excess of  1-2 drinks a day, Artificial Sweeteners, Coffee Creamer, and "Sugar-free" Products. This will help patient to have stable blood glucose profile and potentially avoid unintended weight gain.  -She has  responded to statin treatment with improvement of her LDL to 58.  She is advised to continue atorvastatin 40 mg p.o. nightly.     She previously declined an offer of Qsymia for weight control. she has no contraindications to its use.   3. Essential hypertension -she is advised to home monitor blood pressure and report if > 140/90 on 2 separate readings.  She is following with her cardiologist who is adjusting her losartan to 100 mg p.o. daily, along with amlodipine 5 mg p.o. daily.     4. Osteoporosis:   she has decided to discontinue Alendronate because a family member has had side effects from it. She states that her primary care doctor's office DEXA scan was "normal".  - I advised patient to maintain close follow up with her PCP for primary care needs.     - Time spent on this patient care encounter:  25 minutes of which 50% was spent in  counseling and the rest reviewing  her current and  previous labs / studies and medications  doses and developing a plan for long term care. Hannah Barrett  participated in the discussions, expressed understanding, and voiced agreement with the above plans.  All questions were answered to her satisfaction. she is encouraged to contact clinic should she have any questions or concerns prior to her return visit.  Follow up plan: Return in about 6 months (around 10/02/2019) for Follow up with Pre-visit Labs, Next Visit A1c in Office.  Glade Lloyd, MD Phone: [phone]  Fax: [phone]  This note was partially dictated with voice recognition software. Similar sounding words can be transcribed inadequately or may not  be corrected upon review.  04/01/2019, 5:08 PM

## 2019-04-12 DIAGNOSIS — Z23 Encounter for immunization: Secondary | ICD-10-CM | POA: Diagnosis not present

## 2019-05-17 DIAGNOSIS — M1991 Primary osteoarthritis, unspecified site: Secondary | ICD-10-CM | POA: Diagnosis not present

## 2019-05-17 DIAGNOSIS — K219 Gastro-esophageal reflux disease without esophagitis: Secondary | ICD-10-CM | POA: Diagnosis not present

## 2019-06-03 ENCOUNTER — Telehealth: Payer: Self-pay | Admitting: *Deleted

## 2019-06-03 NOTE — Telephone Encounter (Signed)
Patient called and scheduled a follow up appt for July

## 2019-06-20 ENCOUNTER — Other Ambulatory Visit: Payer: Self-pay | Admitting: "Endocrinology

## 2019-07-17 ENCOUNTER — Encounter: Payer: Self-pay | Admitting: Gynecologic Oncology

## 2019-07-17 DIAGNOSIS — C541 Malignant neoplasm of endometrium: Secondary | ICD-10-CM | POA: Insufficient documentation

## 2019-07-23 ENCOUNTER — Inpatient Hospital Stay: Payer: Medicare Other | Attending: Gynecologic Oncology | Admitting: Gynecologic Oncology

## 2019-07-23 ENCOUNTER — Inpatient Hospital Stay: Payer: Medicare Other

## 2019-07-23 ENCOUNTER — Other Ambulatory Visit: Payer: Self-pay

## 2019-07-23 VITALS — BP 150/58 | HR 82 | Temp 98.7°F | Resp 16 | Ht 68.0 in | Wt 272.8 lb

## 2019-07-23 DIAGNOSIS — I10 Essential (primary) hypertension: Secondary | ICD-10-CM | POA: Diagnosis not present

## 2019-07-23 DIAGNOSIS — E119 Type 2 diabetes mellitus without complications: Secondary | ICD-10-CM | POA: Diagnosis not present

## 2019-07-23 DIAGNOSIS — K5900 Constipation, unspecified: Secondary | ICD-10-CM | POA: Insufficient documentation

## 2019-07-23 DIAGNOSIS — Z90722 Acquired absence of ovaries, bilateral: Secondary | ICD-10-CM | POA: Insufficient documentation

## 2019-07-23 DIAGNOSIS — Z87891 Personal history of nicotine dependence: Secondary | ICD-10-CM | POA: Insufficient documentation

## 2019-07-23 DIAGNOSIS — Z7984 Long term (current) use of oral hypoglycemic drugs: Secondary | ICD-10-CM | POA: Diagnosis not present

## 2019-07-23 DIAGNOSIS — D3911 Neoplasm of uncertain behavior of right ovary: Secondary | ICD-10-CM | POA: Insufficient documentation

## 2019-07-23 DIAGNOSIS — M1990 Unspecified osteoarthritis, unspecified site: Secondary | ICD-10-CM | POA: Insufficient documentation

## 2019-07-23 DIAGNOSIS — Z90710 Acquired absence of both cervix and uterus: Secondary | ICD-10-CM | POA: Diagnosis not present

## 2019-07-23 DIAGNOSIS — E079 Disorder of thyroid, unspecified: Secondary | ICD-10-CM | POA: Insufficient documentation

## 2019-07-23 DIAGNOSIS — Z08 Encounter for follow-up examination after completed treatment for malignant neoplasm: Secondary | ICD-10-CM | POA: Insufficient documentation

## 2019-07-23 DIAGNOSIS — Z79899 Other long term (current) drug therapy: Secondary | ICD-10-CM | POA: Diagnosis not present

## 2019-07-23 DIAGNOSIS — E785 Hyperlipidemia, unspecified: Secondary | ICD-10-CM | POA: Diagnosis not present

## 2019-07-23 DIAGNOSIS — C541 Malignant neoplasm of endometrium: Secondary | ICD-10-CM

## 2019-07-23 DIAGNOSIS — Z8542 Personal history of malignant neoplasm of other parts of uterus: Secondary | ICD-10-CM | POA: Insufficient documentation

## 2019-07-23 NOTE — Patient Instructions (Signed)
Please notify Dr Denman George at phone number [phone] if you notice vaginal bleeding, new pelvic or abdominal pains, bloating, feeling full easy, or a change in bladder or bowel function.   Please contact Dr Serita Grit office (at [phone]) in October or later to request an appointment with her for January, 2021.

## 2019-07-23 NOTE — Progress Notes (Signed)
FOLLOW UP NOTE  Chief Complaint:  Chief Complaint  Patient presents with  . Endometrial cancer [facility])    follow Up    Assessment:    69 y.o. year old with Stage IC right serous LMP and stage IA grade1 endometrioid endometrial cancer.   S/p robotic hysterectomy, BSO on 12/18/14.  She had low risk features in her tumor and therefore no adjuvant therapy was recommended. Preoperative CA 125 was elevated.   No evidence for recurrence on today's exam  Plan: CA 125 today  Discussed signs and symptoms of recurrence including vaginal bleeding or discharge, leg pain or swelling and changes in bowel or bladder habits.  Return to clinic in 6 months to see me.  After that point in time she will discontinue surveillance visits and continue follow-up with her primary care provider, Kassie Mends.  She will see Dr Quintin Alto or discuss with his office that her BP is poorly controlled.   HPI:  Hannah Barrett is a very pleasant 69 year old G1P1 who is seen in consultation at the request of Derrek Monaco (NP) for a complex right ovarian cyst. The patient reports having some bloody and mucoid discharge for approximately 1 year. She ignored it after her father died in 03/30/14, but allerted her physicians to this in October, 2016. At that time they performed an [place] of thepelvis on 11/24/14 which showed an 8.7cm uterus with 60mm endometrial stripe and a 10cm right ovarian cyst with thin septations. The left ovary was normal.  The CT of the abdomen and pelvis on 11/27/14 featured a 9.2cm complex right ovarian cyst with mural nodularity. There was no ascites or peritoneal nodularity or adenopathy.  CA 125 on 11/25/14 was elevated at 238.4 U/mL.  She then underwent a robotic hysterectomy, BSO, on 89/3/81 without complications.  Her postoperative course was uncomplicated.  Her final pathologic diagnosis is a Stage IA Grade 1 endometrioid endometrial cancer with no lymphovascular space invasion, no myometrial invasion,  this was incidentally found. The right ovary contained a serous low malignant potential tumor with no surface involvement. Washings were positive.  Interval Hx: She is seen today for a routine surveillance. She is doing well. She has no complaints including no bleeding or pelvic pain. She has intermittent constipation.  CA 125 on 07/13/15 was normal at 5. CA 125 on 07/11/16 was normal at 6.2 Pap with provider Derrek Monaco was normal with negative hr HPV in January, 2019.  CA 125 on 07/14/17 was normal at 5.6 CA 125 on 08/10/19 was normal at 4.8.  Past Medical History:  Diagnosis Date  . Arthritis   . Diabetes mellitus without complication ([place])   . History of endometrial cancer 01/05/2016  . Hyperlipidemia   . Hypertension   . Overactive bladder   . PMB (postmenopausal bleeding) 11/18/2014  . Thyroid disease    Past Surgical History:  Procedure Laterality Date  . COLONOSCOPY    . COLONOSCOPY N/A 04/09/2015   Procedure: COLONOSCOPY;  Surgeon: Rogene Houston, MD;  [facility];  Service: Endoscopy;  Laterality: N/A;  1030  . COLONOSCOPY N/A 08/27/2018   Procedure: COLONOSCOPY;  Surgeon: Rogene Houston, MD;  [facility];  Service: Endoscopy;  Laterality: N/A;  8:30  . CYSTOSCOPY    . POLYPECTOMY  04/09/2015   Procedure: POLYPECTOMY;  Surgeon: Rogene Houston, MD;  [facility];  Service: Endoscopy;;  Ascending colon polyp removed via hot snare  . POLYPECTOMY  08/27/2018  Procedure: POLYPECTOMY;  Surgeon: Rogene Houston, MD;  [facility];  Service: Endoscopy;;  colon  . ROBOTIC ASSISTED TOTAL HYSTERECTOMY WITH BILATERAL SALPINGO OOPHERECTOMY Bilateral 12/18/2014   Procedure: XI ROBOTIC ASSISTED TOTAL HYSTERECTOMY WITH BILATERAL SALPINGO OOPHORECTOMY;  Surgeon: Everitt Amber, MD;  [facility];  Service: Gynecology;  Laterality: Bilateral;   Family History  Problem Relation Age of Onset  . Congestive Heart Failure Mother   . Arthritis  Father   . Other Father        killed in [place]  . Other Sister        brain injury after fall  . Cancer Sister        multiple mylemoma  . Obesity Daughter   . Diabetes Maternal Grandmother   . Parkinson's disease Paternal Grandfather    Social History   Socioeconomic History  . Marital status: Married    Spouse name: Not on file  . Number of children: Not on file  . Years of education: Not on file  . Highest education level: Not on file  Occupational History  . Not on file  Tobacco Use  . Smoking status: Former Smoker    Packs/day: 2.00    Years: 36.00    Pack years: 72.00    Types: Cigarettes    Start date: 03/03/1968    Quit date: 10/27/2004    Years since quitting: 14.7  . Smokeless tobacco: Never Used  Vaping Use  . Vaping Use: Never used  Substance and Sexual Activity  . Alcohol use: No  . Drug use: No  . Sexual activity: Not Currently    Birth control/protection: Post-menopausal, Surgical    Comment: hyst  Other Topics Concern  . Not on file  Social History Narrative  . Not on file   Social Determinants of Health   Financial Resource Strain:   . Difficulty of Paying Living Expenses:   Food Insecurity:   . Worried About Charity fundraiser in the Last Year:   . Arboriculturist in the Last Year:   Transportation Needs:   . Film/video editor (Medical):   Marland Kitchen Lack of Transportation (Non-Medical):   Physical Activity:   . Days of Exercise per Week:   . Minutes of Exercise per Session:   Stress:   . Feeling of Stress :   Social Connections:   . Frequency of Communication with Friends and Family:   . Frequency of Social Gatherings with Friends and Family:   . Attends Religious Services:   . Active Member of Clubs or Organizations:   . Attends Archivist Meetings:   Marland Kitchen Marital Status:   Intimate Partner Violence:   . Fear of Current or Ex-Partner:   . Emotionally Abused:   Marland Kitchen Physically Abused:   . Sexually Abused:    Current Outpatient  Medications on File Prior to Visit  Medication Sig Dispense Refill  . acetaminophen (TYLENOL) 500 MG tablet Take 1,000 mg by mouth 2 (two) times daily.    Marland Kitchen amLODipine (NORVASC) 10 MG tablet Take 10 mg by mouth daily.    Marland Kitchen atorvastatin (LIPITOR) 40 MG tablet Take 1 tablet (40 mg total) by mouth daily. 90 tablet 3  . cholecalciferol (VITAMIN D3) 25 MCG (1000 UT) tablet Take 1,000 Units by mouth daily at 12 noon.    Marland Kitchen levothyroxine (SYNTHROID) 125 MCG tablet TAKE 1 TABLET BY MOUTH EVERY DAY BEFORE BREAKFAST 90 tablet 3  . losartan (COZAAR) 100 MG tablet  Take 1 tablet (100 mg total) by mouth daily. 90 tablet 1  . metFORMIN (GLUCOPHAGE-XR) 500 MG 24 hr tablet TAKE 1 TABLET BY MOUTH EVERY DAY WITH BREAKFAST 90 tablet 2  . Multiple Vitamins-Minerals (CENTRUM SILVER PO) Take 1 tablet by mouth every evening.     . chlorthalidone (HYGROTON) 25 MG tablet Take 0.5 tablets (12.5 mg total) by mouth daily. 15 tablet 6   No current facility-administered medications on file prior to visit.   No Known Allergies   Review of systems: Constitutional:  She has no weight gain or weight loss. She has no fever or chills. Eyes: No blurred vision Ears, Nose, Mouth, Throat: No dizziness, headaches or changes in hearing. No mouth sores. Cardiovascular: No chest pain, palpitations or edema. Respiratory:  No shortness of breath, wheezing or cough Gastrointestinal: She has normal bowel movements without diarrhea or constipation. She denies any nausea or vomiting. She denies blood in her stool or heart burn. Genitourinary:  She denies pelvic pain, pelvic pressure or changes in her urinary function. She has no hematuria, dysuria, or incontinence. No vaginal bleeding and vaginal discharge Musculoskeletal: + generalized arthritis pain Skin:  She has no skin changes, rashes or itching Neurological:  Denies dizziness or headaches. No neuropathy, no numbness or tingling. Psychiatric:  She denies depression or  anxiety. Hematologic/Lymphatic:   No easy bruising or bleeding   Physical Exam: Blood pressure (!) 150/58, pulse 82, temperature 98.7 F (37.1 C), temperature source Oral, resp. rate 16, height 5\' 8"  (1.727 m), weight 272 lb 12.8 oz (123.7 kg), SpO2 97 %. General: Well dressed, well nourished in no apparent distress.   HEENT:  Normocephalic and atraumatic, no lesions.  Extraocular muscles intact. Sclerae anicteric. Pupils equal, round, reactive. No mouth sores or ulcers. Thyroid is normal size, not nodular, midline. Abdomen:  Soft, nontender, nondistended.  No palpable masses.  No hepatosplenomegaly.  No ascites. Normal bowel sounds.  Small umbilical hernia - no incarceration or strangulation.  Incisions are well healed. Genitourinary: Normal EGBUS  Vaginal cuff intact.  No bleeding or discharge.  Limited exam due to long vagina and narrow upper vagina. No cul de sac fullness. Extremities: No cyanosis, clubbing or edema.  No calf tenderness or erythema. No palpable cords. Psychiatric: Mood and affect are appropriate. Neurological: Awake, alert and oriented x 3. Sensation is intact, no neuropathy.  Musculoskeletal: No pain, normal strength and range of motion.   Thereasa Solo, MD

## 2019-07-24 LAB — CA 125: Cancer Antigen (CA) 125: 4.8 U/mL (ref 0.0–38.1)

## 2019-07-25 ENCOUNTER — Telehealth: Payer: Self-pay

## 2019-07-25 NOTE — Telephone Encounter (Signed)
Told Ms Hinz that her CA-125 was good and WNL at 4.8 per Melissa Cross,NP.

## 2019-07-30 DIAGNOSIS — R202 Paresthesia of skin: Secondary | ICD-10-CM | POA: Diagnosis not present

## 2019-07-30 DIAGNOSIS — I10 Essential (primary) hypertension: Secondary | ICD-10-CM | POA: Diagnosis not present

## 2019-07-30 DIAGNOSIS — Z6839 Body mass index (BMI) 39.0-39.9, adult: Secondary | ICD-10-CM | POA: Diagnosis not present

## 2019-07-30 DIAGNOSIS — R5382 Chronic fatigue, unspecified: Secondary | ICD-10-CM | POA: Diagnosis not present

## 2019-07-30 DIAGNOSIS — M8580 Other specified disorders of bone density and structure, unspecified site: Secondary | ICD-10-CM | POA: Diagnosis not present

## 2019-07-30 DIAGNOSIS — E782 Mixed hyperlipidemia: Secondary | ICD-10-CM | POA: Diagnosis not present

## 2019-07-30 DIAGNOSIS — G4733 Obstructive sleep apnea (adult) (pediatric): Secondary | ICD-10-CM | POA: Diagnosis not present

## 2019-07-30 DIAGNOSIS — R350 Frequency of micturition: Secondary | ICD-10-CM | POA: Diagnosis not present

## 2019-08-01 DIAGNOSIS — E039 Hypothyroidism, unspecified: Secondary | ICD-10-CM | POA: Diagnosis not present

## 2019-08-01 DIAGNOSIS — E782 Mixed hyperlipidemia: Secondary | ICD-10-CM | POA: Diagnosis not present

## 2019-08-01 DIAGNOSIS — M8580 Other specified disorders of bone density and structure, unspecified site: Secondary | ICD-10-CM | POA: Diagnosis not present

## 2019-08-01 DIAGNOSIS — I10 Essential (primary) hypertension: Secondary | ICD-10-CM | POA: Diagnosis not present

## 2019-08-01 DIAGNOSIS — E119 Type 2 diabetes mellitus without complications: Secondary | ICD-10-CM | POA: Diagnosis not present

## 2019-08-01 DIAGNOSIS — Z0000 Encounter for general adult medical examination without abnormal findings: Secondary | ICD-10-CM | POA: Diagnosis not present

## 2019-08-16 DIAGNOSIS — E039 Hypothyroidism, unspecified: Secondary | ICD-10-CM | POA: Diagnosis not present

## 2019-08-16 DIAGNOSIS — E7849 Other hyperlipidemia: Secondary | ICD-10-CM | POA: Diagnosis not present

## 2019-08-16 DIAGNOSIS — K219 Gastro-esophageal reflux disease without esophagitis: Secondary | ICD-10-CM | POA: Diagnosis not present

## 2019-08-16 DIAGNOSIS — I10 Essential (primary) hypertension: Secondary | ICD-10-CM | POA: Diagnosis not present

## 2019-09-17 DIAGNOSIS — E7849 Other hyperlipidemia: Secondary | ICD-10-CM | POA: Diagnosis not present

## 2019-09-17 DIAGNOSIS — K219 Gastro-esophageal reflux disease without esophagitis: Secondary | ICD-10-CM | POA: Diagnosis not present

## 2019-09-17 DIAGNOSIS — E039 Hypothyroidism, unspecified: Secondary | ICD-10-CM | POA: Diagnosis not present

## 2019-09-17 DIAGNOSIS — I10 Essential (primary) hypertension: Secondary | ICD-10-CM | POA: Diagnosis not present

## 2019-10-01 ENCOUNTER — Other Ambulatory Visit: Payer: Self-pay

## 2019-10-01 DIAGNOSIS — E559 Vitamin D deficiency, unspecified: Secondary | ICD-10-CM | POA: Diagnosis not present

## 2019-10-01 DIAGNOSIS — E118 Type 2 diabetes mellitus with unspecified complications: Secondary | ICD-10-CM | POA: Diagnosis not present

## 2019-10-01 DIAGNOSIS — E782 Mixed hyperlipidemia: Secondary | ICD-10-CM | POA: Diagnosis not present

## 2019-10-01 DIAGNOSIS — E038 Other specified hypothyroidism: Secondary | ICD-10-CM | POA: Diagnosis not present

## 2019-10-02 LAB — COMPLETE METABOLIC PANEL WITH GFR
AG Ratio: 1.6 (calc) (ref 1.0–2.5)
ALT: 15 U/L (ref 6–29)
AST: 15 U/L (ref 10–35)
Albumin: 4.3 g/dL (ref 3.6–5.1)
Alkaline phosphatase (APISO): 99 U/L (ref 37–153)
BUN: 12 mg/dL (ref 7–25)
CO2: 27 mmol/L (ref 20–32)
Calcium: 9.4 mg/dL (ref 8.6–10.4)
Chloride: 105 mmol/L (ref 98–110)
Creat: 0.57 mg/dL (ref 0.50–0.99)
GFR, Est African American: 110 mL/min/{1.73_m2} (ref >=60)
GFR, Est Non African American: 95 mL/min/{1.73_m2} (ref >=60)
Globulin: 2.7 g/dL (calc) (ref 1.9–3.7)
Glucose, Bld: 121 mg/dL — ABNORMAL HIGH (ref 65–99)
Potassium: 3.9 mmol/L (ref 3.5–5.3)
Sodium: 142 mmol/L (ref 135–146)
Total Bilirubin: 0.4 mg/dL (ref 0.2–1.2)
Total Protein: 7 g/dL (ref 6.1–8.1)

## 2019-10-02 LAB — VITAMIN D 25 HYDROXY (VIT D DEFICIENCY, FRACTURES): Vit D, 25-Hydroxy: 43 ng/mL (ref 30–100)

## 2019-10-02 LAB — T4, FREE: Free T4: 1.2 ng/dL (ref 0.8–1.8)

## 2019-10-02 LAB — TSH: TSH: 2.16 mIU/L (ref 0.40–4.50)

## 2019-10-04 ENCOUNTER — Telehealth (INDEPENDENT_AMBULATORY_CARE_PROVIDER_SITE_OTHER): Payer: Medicare Other | Admitting: "Endocrinology

## 2019-10-04 ENCOUNTER — Encounter: Payer: Self-pay | Admitting: "Endocrinology

## 2019-10-04 VITALS — Ht 68.0 in | Wt 273.0 lb

## 2019-10-04 DIAGNOSIS — E782 Mixed hyperlipidemia: Secondary | ICD-10-CM

## 2019-10-04 DIAGNOSIS — E559 Vitamin D deficiency, unspecified: Secondary | ICD-10-CM

## 2019-10-04 DIAGNOSIS — E038 Other specified hypothyroidism: Secondary | ICD-10-CM

## 2019-10-04 DIAGNOSIS — E118 Type 2 diabetes mellitus with unspecified complications: Secondary | ICD-10-CM | POA: Diagnosis not present

## 2019-10-04 MED ORDER — METFORMIN HCL ER 500 MG PO TB24
ORAL_TABLET | ORAL | 1 refills | Status: DC
Start: 2019-10-04 — End: 2020-08-04

## 2019-10-04 MED ORDER — LEVOTHYROXINE SODIUM 125 MCG PO TABS
ORAL_TABLET | ORAL | 1 refills | Status: DC
Start: 2019-10-04 — End: 2020-08-04

## 2019-10-04 NOTE — Progress Notes (Signed)
10/04/2019                                     Endocrinology Telehealth Visit Follow up Note -During COVID -19 Pandemic  This visit type was conducted  via telephone due to national recommendations for restrictions regarding the COVID-19 Pandemic  in an effort to limit this patient's exposure and mitigate transmission of the corona virus.   I connected with Hannah Barrett on 10/04/2019   by telephone and verified that I am speaking with the correct person using two identifiers. Hannah Barrett, [birth year]. she has verbally consented to this visit.  I was in my office and patient was in her residence. No other persons were with me during the encounter. All issues noted in this document were discussed and addressed. The format was not optimal for physical exam.    Subjective:    Patient ID: Hannah Barrett, female    DOB: Sep 16, 1950, PCP Jalene Mullet, PA-C   Past Medical History:  Diagnosis Date  . Arthritis   . Diabetes mellitus without complication ([place])   . History of endometrial cancer 01/05/2016  . Hyperlipidemia   . Hypertension   . Overactive bladder   . PMB (postmenopausal bleeding) 11/18/2014  . Thyroid disease    Past Surgical History:  Procedure Laterality Date  . COLONOSCOPY    . COLONOSCOPY N/A 04/09/2015   Procedure: COLONOSCOPY;  Surgeon: Rogene Houston, MD;  [facility];  Service: Endoscopy;  Laterality: N/A;  1030  . COLONOSCOPY N/A 08/27/2018   Procedure: COLONOSCOPY;  Surgeon: Rogene Houston, MD;  [facility];  Service: Endoscopy;  Laterality: N/A;  8:30  . CYSTOSCOPY    . POLYPECTOMY  04/09/2015   Procedure: POLYPECTOMY;  Surgeon: Rogene Houston, MD;  [facility];  Service: Endoscopy;;  Ascending colon polyp removed via hot snare  . POLYPECTOMY  08/27/2018   Procedure: POLYPECTOMY;  Surgeon: Rogene Houston, MD;  [facility];  Service: Endoscopy;;  colon  . ROBOTIC ASSISTED TOTAL HYSTERECTOMY WITH BILATERAL  SALPINGO OOPHERECTOMY Bilateral 12/18/2014   Procedure: XI ROBOTIC ASSISTED TOTAL HYSTERECTOMY WITH BILATERAL SALPINGO OOPHORECTOMY;  Surgeon: Everitt Amber, MD;  Location: [facility];  Service: Gynecology;  Laterality: Bilateral;   Social History   Socioeconomic History  . Marital status: Married    Spouse name: Not on file  . Number of children: Not on file  . Years of education: Not on file  . Highest education level: Not on file  Occupational History  . Not on file  Tobacco Use  . Smoking status: Former Smoker    Packs/day: 2.00    Years: 36.00    Pack years: 72.00    Types: Cigarettes    Start date: 03/03/1968    Quit date: 10/27/2004    Years since quitting: 14.9  . Smokeless tobacco: Never Used  Vaping Use  . Vaping Use: Never used  Substance and Sexual Activity  . Alcohol use: No  . Drug use: No  . Sexual activity: Not Currently    Birth control/protection: Post-menopausal, Surgical    Comment: hyst  Other Topics Concern  . Not on file  Social History Narrative  . Not on file   Social Determinants of Health   Financial Resource Strain:   . Difficulty of Paying Living Expenses: Not on file  Food Insecurity:   .  Worried About Charity fundraiser in the Last Year: Not on file  . Ran Out of Food in the Last Year: Not on file  Transportation Needs:   . Lack of Transportation (Medical): Not on file  . Lack of Transportation (Non-Medical): Not on file  Physical Activity:   . Days of Exercise per Week: Not on file  . Minutes of Exercise per Session: Not on file  Stress:   . Feeling of Stress : Not on file  Social Connections:   . Frequency of Communication with Friends and Family: Not on file  . Frequency of Social Gatherings with Friends and Family: Not on file  . Attends Religious Services: Not on file  . Active Member of Clubs or Organizations: Not on file  . Attends Archivist Meetings: Not on file  . Marital Status: Not on file   Outpatient Encounter  Medications as of 10/04/2019  Medication Sig  . acetaminophen (TYLENOL) 500 MG tablet Take 1,000 mg by mouth 2 (two) times daily.  Marland Kitchen amLODipine (NORVASC) 10 MG tablet Take 10 mg by mouth daily.  Marland Kitchen atorvastatin (LIPITOR) 40 MG tablet Take 1 tablet (40 mg total) by mouth daily.  . cholecalciferol (VITAMIN D3) 25 MCG (1000 UT) tablet Take 1,000 Units by mouth daily at 12 noon.  Marland Kitchen levothyroxine (SYNTHROID) 125 MCG tablet TAKE 1 TABLET BY MOUTH EVERY DAY BEFORE BREAKFAST  . losartan (COZAAR) 100 MG tablet Take 1 tablet (100 mg total) by mouth daily.  . metFORMIN (GLUCOPHAGE-XR) 500 MG 24 hr tablet TAKE 1 TABLET BY MOUTH EVERY DAY WITH BREAKFAST  . Multiple Vitamins-Minerals (CENTRUM SILVER PO) Take 1 tablet by mouth every evening.   . [DISCONTINUED] chlorthalidone (HYGROTON) 25 MG tablet Take 0.5 tablets (12.5 mg total) by mouth daily.  . [DISCONTINUED] levothyroxine (SYNTHROID) 125 MCG tablet TAKE 1 TABLET BY MOUTH EVERY DAY BEFORE BREAKFAST  . [DISCONTINUED] metFORMIN (GLUCOPHAGE-XR) 500 MG 24 hr tablet TAKE 1 TABLET BY MOUTH EVERY DAY WITH BREAKFAST   No facility-administered encounter medications on file as of 10/04/2019.   ALLERGIES: No Known Allergies VACCINATION STATUS:  There is no immunization history on file for this patient.  69 yr old with hx of RAI induced hypothyroidism, and controlled type 2 DM .  She is being engaged in telehealth via telephone for follow-up of her hypothyroidism-RAI induced, here for follow-up for RAI induced hypothyroidism, as well as type 2 diabetes.  She is currently on levothyroxine 125 mcg p.o. daily before breakfast.   She is compliant, has no new complaints.  She denies heat intolerance, palpitations, and tremors.  She has a steady weight since last visit. -Her previsit labs show A1c of 7.2%, slightly increased from 6.7%.  She is tolerating her Metformin 500 mg ER daily after breakfast.    Review of Systems Limited as above.  Objective:    Ht 5\' 8"   (1.727 m)   Wt 273 lb (123.8 kg)   BMI 41.51 kg/m   Wt Readings from Last 3 Encounters:  10/04/19 273 lb (123.8 kg)  07/23/19 272 lb 12.8 oz (123.7 kg)  12/24/18 277 lb 12.8 oz (126 kg)     Results for orders placed or performed in visit on 07/23/19  CA 125  Result Value Ref Range   Cancer Antigen (CA) 125 4.8 0.0 - 38.1 U/mL   Complete Blood Count (Most recent): Lab Results  Component Value Date   WBC 12.6 (H) 12/19/2014   HGB 11.1 (L) 12/19/2014  HCT 34.8 (L) 12/19/2014   MCV 93.5 12/19/2014   PLT 260 12/19/2014   Chemistry (most recent): Lab Results  Component Value Date   NA 142 10/01/2019   K 3.9 10/01/2019   CL 105 10/01/2019   CO2 27 10/01/2019   BUN 12 10/01/2019   CREATININE 0.57 10/01/2019   Diabetic Labs (most recent): Lab Results  Component Value Date   HGBA1C 7.2 03/25/2019   HGBA1C 6.7 03/19/2018   HGBA1C 6.5 09/20/2017      Assessment & Plan:   1. RAI  hypothyroidism:  -Her thyroid function tests are consistent with appropriate replacement.  She is advised to continue  levothyroxine 125 mcg p.o. daily before breakfast.   - We discussed about the correct intake of her thyroid hormone, on empty stomach at fasting, with water, separated by at least 30 minutes from breakfast and other medications,  and separated by more than 4 hours from calcium, iron, multivitamins, acid reflux medications (PPIs). -Patient is made aware of the fact that thyroid hormone replacement is needed for life, dose to be adjusted by periodic monitoring of thyroid function tests.    2. Type 2 diabetes mellitus with complication - Her recent A1c is 7.2% slightly increasing from 6.7%. -She is tolerating her metformin.  She is advised to continue Metformin 500 mg extended release daily after breakfast.     she is following with Jearld Fenton, CDE for DM education.  -  Suggestion is made for her to avoid simple carbohydrates  from her diet including Cakes, Sweet Desserts /  Pastries, Ice Cream, Soda (diet and regular), Sweet Tea, Candies, Chips, Cookies, Sweet Pastries,  Store Bought Juices, Alcohol in Excess of  1-2 drinks a day, Artificial Sweeteners, Coffee Creamer, and "Sugar-free" Products. This will help patient to have stable blood glucose profile and potentially avoid unintended weight gain.  -She has responded to statin treatment with improvement of her LDL to 58.  She is advised to continue atorvastatin 40 mg p.o. nightly.     She previously declined an offer of Qsymia for weight control. she has no contraindications to its use.   3. Essential hypertension -she is advised to home monitor blood pressure and report if > 140/90 on 2 separate readings.  She is following with her cardiologist who is adjusting her losartan to 100 mg p.o. daily, along with amlodipine 5 mg p.o. daily.     4. Osteoporosis:   she has decided to discontinue Alendronate because a family member has had side effects from it. She states that her primary care doctor's office DEXA scan was "normal".  - I advised patient to maintain close follow up with her PCP for primary care needs.      - Time spent on this patient care encounter:  20 minutes of which 50% was spent in  counseling and the rest reviewing  her current and  previous labs / studies and medications  doses and developing a plan for long term care. Hannah Barrett  participated in the discussions, expressed understanding, and voiced agreement with the above plans.  All questions were answered to her satisfaction. she is encouraged to contact clinic should she have any questions or concerns prior to her return visit.  Follow up plan: Return in about 6 months (around 04/02/2020) for F/U with Pre-visit Labs, NV A1c in Office.  Glade Lloyd, MD Phone: [phone]  Fax: [phone]  This note was partially dictated with voice recognition software. Similar sounding words can be transcribed  inadequately or may not  be corrected  upon review.  10/04/2019, 11:21 AM

## 2019-10-31 DIAGNOSIS — M810 Age-related osteoporosis without current pathological fracture: Secondary | ICD-10-CM | POA: Diagnosis not present

## 2019-10-31 DIAGNOSIS — Z1231 Encounter for screening mammogram for malignant neoplasm of breast: Secondary | ICD-10-CM | POA: Diagnosis not present

## 2019-11-17 ENCOUNTER — Other Ambulatory Visit: Payer: Self-pay | Admitting: Cardiology

## 2019-11-26 DIAGNOSIS — I10 Essential (primary) hypertension: Secondary | ICD-10-CM | POA: Diagnosis not present

## 2019-11-26 DIAGNOSIS — E119 Type 2 diabetes mellitus without complications: Secondary | ICD-10-CM | POA: Diagnosis not present

## 2019-11-26 DIAGNOSIS — E782 Mixed hyperlipidemia: Secondary | ICD-10-CM | POA: Diagnosis not present

## 2019-11-29 DIAGNOSIS — R350 Frequency of micturition: Secondary | ICD-10-CM | POA: Diagnosis not present

## 2019-11-29 DIAGNOSIS — K219 Gastro-esophageal reflux disease without esophagitis: Secondary | ICD-10-CM | POA: Diagnosis not present

## 2019-11-29 DIAGNOSIS — E782 Mixed hyperlipidemia: Secondary | ICD-10-CM | POA: Diagnosis not present

## 2019-11-29 DIAGNOSIS — I10 Essential (primary) hypertension: Secondary | ICD-10-CM | POA: Diagnosis not present

## 2019-11-29 DIAGNOSIS — Z6839 Body mass index (BMI) 39.0-39.9, adult: Secondary | ICD-10-CM | POA: Diagnosis not present

## 2019-11-29 DIAGNOSIS — E039 Hypothyroidism, unspecified: Secondary | ICD-10-CM | POA: Diagnosis not present

## 2019-11-29 DIAGNOSIS — R202 Paresthesia of skin: Secondary | ICD-10-CM | POA: Diagnosis not present

## 2019-11-29 DIAGNOSIS — Z23 Encounter for immunization: Secondary | ICD-10-CM | POA: Diagnosis not present

## 2019-12-15 ENCOUNTER — Other Ambulatory Visit: Payer: Self-pay | Admitting: Cardiology

## 2019-12-16 ENCOUNTER — Telehealth: Payer: Self-pay | Admitting: *Deleted

## 2019-12-16 NOTE — Telephone Encounter (Signed)
Attempted to return the patient's call; received a message that the phone number is screening calls and call can't go through

## 2019-12-20 ENCOUNTER — Telehealth: Payer: Self-pay | Admitting: Family Medicine

## 2019-12-20 MED ORDER — LOSARTAN POTASSIUM 100 MG PO TABS: 100.0000 mg | ORAL_TABLET | Freq: Every day | ORAL | 1 refills | Status: AC

## 2019-12-20 NOTE — Telephone Encounter (Signed)
°*  STAT* If patient is at the pharmacy, call can be transferred to refill team.   1. Which medications need to be refilled? (please list name of each medication and dose if known)  Losartan 100 mg   2. Which pharmacy/location (including street and city if local pharmacy) is medication to be sent to?  [facility] [place]   3. Do they need a 30 day or 90 day supply? 90   Patient has 1 pill left

## 2019-12-24 ENCOUNTER — Telehealth: Payer: Self-pay | Admitting: *Deleted

## 2019-12-24 NOTE — Telephone Encounter (Signed)
Patient called and scheduled a follow up appt for January

## 2019-12-30 ENCOUNTER — Telehealth: Payer: Self-pay | Admitting: *Deleted

## 2019-12-30 ENCOUNTER — Other Ambulatory Visit: Payer: Self-pay | Admitting: Cardiology

## 2019-12-30 NOTE — Telephone Encounter (Signed)
Pt requesting refills on Chorthalidone 12.5 mg daily - says she has been out for a week and medication was removed in September when she has video appt with Dr Dorris Fetch do not see reason listed as to why this was removed - pt has appt with NP on 12/22 - will forward to Dr Harl Bowie if ok to refill

## 2019-12-31 DIAGNOSIS — Z23 Encounter for immunization: Secondary | ICD-10-CM | POA: Diagnosis not present

## 2019-12-31 MED ORDER — CHLORTHALIDONE 25 MG PO TABS: 12.5000 mg | ORAL_TABLET | Freq: Every day | ORAL | 0 refills | Status: DC

## 2019-12-31 NOTE — Telephone Encounter (Signed)
Medication sent to pharmacy. Pt aware. ?

## 2019-12-31 NOTE — Telephone Encounter (Signed)
Ok to refill chlorthalidone   Zandra Abts MD

## 2020-01-07 NOTE — Progress Notes (Signed)
Cardiology Office Note  Date: 01/08/2020   ID: Hannah Barrett, DOB [birth year], MRN [id number]  PCP:  Jalene Mullet, PA-C  Cardiologist:  Carlyle Dolly, MD Electrophysiologist:  None   Chief Complaint: Follow-up palpitations, HTN, HLD  History of Present Illness: Hannah Barrett is a 69 y.o. female with a history of palpitations, hypertension, hyperlipidemia, DM 2, thyroid disease.  Last encounter with Dr. Harl Bowie 12/24/2018 via telemedicine.  History of palpitations with event monitor March 2018 without arrhythmias.  Having no recent palpitations.  Previous cough on ACE inhibitor.  She was changed to ARB by PCP.  She was taking her losartan a few weeks prior to that visit.  She was confused as to whether she was supposed to be on the medication.  She was compliant with her CPAP and had not been followed by physician in several years.  She was followed by her PCP for her diabetes.  She is here for 1 year follow-up.  She denies any significant hospitalizations or acute illnesses in the interim since last visit.  She continues to see Dr. Dorris Fetch for her diabetes.  Last hemoglobin A1c in March 2021 was 7.2.  States she recently had one episode of vertigo which resolved.  She has had no further episodes.  Denies any anginal or exertional symptoms.  She denies any orthostatic symptoms i.e. lightheadedness, dizziness, presyncope or syncopal episode.  Denies any CVA or TIA-like symptoms.  Very seldom has palpitations.  No PND or orthopnea.  No bleeding, no claudication.  No lower extremity edema.  Blood pressure is remaining stable.   Past Medical History:  Diagnosis Date  . Arthritis   . Diabetes mellitus without complication ([place])   . History of endometrial cancer 01/05/2016  . Hyperlipidemia   . Hypertension   . Overactive bladder   . PMB (postmenopausal bleeding) 11/18/2014  . Thyroid disease     Past Surgical History:  Procedure Laterality Date  . COLONOSCOPY    . COLONOSCOPY N/A  04/09/2015   Procedure: COLONOSCOPY;  Surgeon: Rogene Houston, MD;  [facility];  Service: Endoscopy;  Laterality: N/A;  1030  . COLONOSCOPY N/A 08/27/2018   Procedure: COLONOSCOPY;  Surgeon: Rogene Houston, MD;  [facility];  Service: Endoscopy;  Laterality: N/A;  8:30  . CYSTOSCOPY    . POLYPECTOMY  04/09/2015   Procedure: POLYPECTOMY;  Surgeon: Rogene Houston, MD;  [facility];  Service: Endoscopy;;  Ascending colon polyp removed via hot snare  . POLYPECTOMY  08/27/2018   Procedure: POLYPECTOMY;  Surgeon: Rogene Houston, MD;  [facility];  Service: Endoscopy;;  colon  . ROBOTIC ASSISTED TOTAL HYSTERECTOMY WITH BILATERAL SALPINGO OOPHERECTOMY Bilateral 12/18/2014   Procedure: XI ROBOTIC ASSISTED TOTAL HYSTERECTOMY WITH BILATERAL SALPINGO OOPHORECTOMY;  Surgeon: Everitt Amber, MD;  Location: [facility];  Service: Gynecology;  Laterality: Bilateral;    Current Outpatient Medications  Medication Sig Dispense Refill  . acetaminophen (TYLENOL) 500 MG tablet Take 1,000 mg by mouth 2 (two) times daily.    Marland Kitchen amLODipine (NORVASC) 10 MG tablet Take 10 mg by mouth daily.    Marland Kitchen atorvastatin (LIPITOR) 40 MG tablet Take 1 tablet (40 mg total) by mouth daily. 90 tablet 3  . cholecalciferol (VITAMIN D3) 25 MCG (1000 UT) tablet Take 1,000 Units by mouth daily at 12 noon.    Marland Kitchen levothyroxine (SYNTHROID) 125 MCG tablet TAKE 1 TABLET BY MOUTH EVERY DAY BEFORE BREAKFAST 90 tablet 1  .  losartan (COZAAR) 100 MG tablet Take 1 tablet (100 mg total) by mouth daily. 90 tablet 1  . metFORMIN (GLUCOPHAGE-XR) 500 MG 24 hr tablet TAKE 1 TABLET BY MOUTH EVERY DAY WITH BREAKFAST 90 tablet 1  . Multiple Vitamins-Minerals (CENTRUM SILVER PO) Take 1 tablet by mouth every evening.     . chlorthalidone (HYGROTON) 25 MG tablet Take 0.5 tablets (12.5 mg total) by mouth daily. 45 tablet 3   No current facility-administered medications for this visit.   Allergies:  Patient has no known  allergies.   Social History: The patient  reports that she quit smoking about 15 years ago. Her smoking use included cigarettes. She started smoking about 51 years ago. She has a 72.00 pack-year smoking history. She has never used smokeless tobacco. She reports that she does not drink alcohol and does not use drugs.   Family History: The patient's family history includes Arthritis in her father; Cancer in her sister; Congestive Heart Failure in her mother; Diabetes in her maternal grandmother; Obesity in her daughter; Other in her father and sister; Parkinson's disease in her paternal grandfather.   ROS:  Please see the history of present illness. Otherwise, complete review of systems is positive for none.  All other systems are reviewed and negative.   Physical Exam: VS:  BP 136/64   Pulse 84   Ht 5\' 8"  (1.727 m)   Wt 272 lb 12.8 oz (123.7 kg)   SpO2 97%   BMI 41.48 kg/m , BMI Body mass index is 41.48 kg/m.  Wt Readings from Last 3 Encounters:  01/08/20 272 lb 12.8 oz (123.7 kg)  10/04/19 273 lb (123.8 kg)  07/23/19 272 lb 12.8 oz (123.7 kg)    General: Morbidly obese patient appears comfortable at rest. Neck: Supple, no elevated JVP or carotid bruits, no thyromegaly. Lungs: Clear to auscultation, nonlabored breathing at rest. Cardiac: Regular rate and rhythm, no S3 or significant systolic murmur, no pericardial rub. Extremities: No pitting edema, distal pulses 2+. Skin: Warm and dry. Musculoskeletal: No kyphosis. Neuropsychiatric: Alert and oriented x3, affect grossly appropriate.  ECG:  An ECG dated 01/08/2020 was personally reviewed today and demonstrated:  Normal sinus rhythm with nonspecific ST abnormality rate of 85.  Recent Labwork: 01/14/2019: Magnesium 1.9 10/01/2019: ALT 15; AST 15; BUN 12; Creat 0.57; Potassium 3.9; Sodium 142; TSH 2.16     Component Value Date/Time   CHOL 119 03/19/2018 0000   TRIG 99 03/19/2018 0000   HDL 41 03/19/2018 0000   LDLCALC 58  03/19/2018 0000    Other Studies Reviewed Today:   03/2016 echo Study Conclusions  - Left ventricle: The cavity size was normal. Wall thickness was normal. Systolic function was normal. The estimated ejection fraction was in the range of 60% to 65%. Wall motion was normal; there were no regional wall motion abnormalities. Doppler parameters are consistent with abnormal left ventricular relaxation (grade 1 diastolic dysfunction).   03/2016 Cardiac monitor  Telemetry tracings show normal sinus rhythm  No symptoms reported  No significant arrhythmias  Assessment and Plan:  1. Palpitations   2. Essential hypertension   3. Mixed hyperlipidemia   4. Obstructive sleep apnea     1. Palpitations Having very infrequent palpitations.  2. Essential hypertension Blood pressure reasonably well controlled on current therapy.  Continue amlodipine 10 mg daily, chlorthalidone 12.5 mg p.o. daily, losartan 100 mg p.o. daily.  3. Mixed hyperlipidemia Continue atorvastatin 40 mg daily.Last lipid panel 03/2018: Total cholesterol 119, triglycerides 99, HDL  41, LDL 58  4. Obstructive sleep apnea Patient states she is compliant with her CPAP therapy.   Medication Adjustments/Labs and Tests Ordered: Current medicines are reviewed at length with the patient today.  Concerns regarding medicines are outlined above.   Disposition: Follow-up with Dr. Harl Bowie or APP 1 year  Signed, Levell July, NP 01/08/2020 9:33 AM    [facility] [place], [place], [place] [address] Phone: [phone]; Fax: [phone]

## 2020-01-08 ENCOUNTER — Encounter: Payer: Self-pay | Admitting: Family Medicine

## 2020-01-08 ENCOUNTER — Ambulatory Visit (INDEPENDENT_AMBULATORY_CARE_PROVIDER_SITE_OTHER): Payer: Medicare Other | Admitting: Family Medicine

## 2020-01-08 ENCOUNTER — Ambulatory Visit: Payer: Medicare Other | Admitting: Cardiology

## 2020-01-08 VITALS — BP 136/64 | HR 84 | Ht 68.0 in | Wt 272.8 lb

## 2020-01-08 DIAGNOSIS — R002 Palpitations: Secondary | ICD-10-CM

## 2020-01-08 DIAGNOSIS — E782 Mixed hyperlipidemia: Secondary | ICD-10-CM | POA: Diagnosis not present

## 2020-01-08 DIAGNOSIS — G4733 Obstructive sleep apnea (adult) (pediatric): Secondary | ICD-10-CM | POA: Diagnosis not present

## 2020-01-08 DIAGNOSIS — I10 Essential (primary) hypertension: Secondary | ICD-10-CM | POA: Diagnosis not present

## 2020-01-08 MED ORDER — CHLORTHALIDONE 25 MG PO TABS: 12.5000 mg | ORAL_TABLET | Freq: Every day | ORAL | 3 refills | Status: DC

## 2020-01-08 NOTE — Patient Instructions (Signed)

## 2020-01-17 DIAGNOSIS — E7849 Other hyperlipidemia: Secondary | ICD-10-CM | POA: Diagnosis not present

## 2020-01-17 DIAGNOSIS — I10 Essential (primary) hypertension: Secondary | ICD-10-CM | POA: Diagnosis not present

## 2020-01-17 DIAGNOSIS — E039 Hypothyroidism, unspecified: Secondary | ICD-10-CM | POA: Diagnosis not present

## 2020-01-22 NOTE — Progress Notes (Signed)
FOLLOW UP NOTE  Chief Complaint:  Chief Complaint  Patient presents with  . Ovarian tumor of borderline malignancy, right    Assessment:   70 y.o. year old with Stage IC right serous LMP and stage IA grade1 endometrioid endometrial cancer.   S/p robotic hysterectomy, BSO on 12/18/14.  She had low risk features in her tumor and therefore no adjuvant therapy was recommended. Preoperative CA 125 was elevated.  No evidence for recurrence on today's exam  Plan: CA 125 today  Discussed signs and symptoms of recurrence including vaginal bleeding or discharge, leg pain or swelling and changes in bowel or bladder habits.  She has completed her period of cancer surveillance and  continue follow-up with her primary care provider, Amy Luciana Axe.   HPI:  Hannah Barrett is a very pleasant 70 year old G1P1 who is seen in consultation at the request of Derrek Monaco (NP) for a complex right ovarian cyst. The patient reports having some bloody and mucoid discharge for approximately 1 year. She ignored it after her father died in 2014/04/16, but allerted her physicians to this in October, 2016. At that time they performed an [place] of thepelvis on 11/24/14 which showed an 8.7cm uterus with 44mm endometrial stripe and a 10cm right ovarian cyst with thin septations. The left ovary was normal.  The CT of the abdomen and pelvis on 11/27/14 featured a 9.2cm complex right ovarian cyst with mural nodularity. There was no ascites or peritoneal nodularity or adenopathy.  CA 125 on 11/25/14 was elevated at 238.4 U/mL.  She then underwent a robotic hysterectomy, BSO, on 46/8/03 without complications.  Her postoperative course was uncomplicated.  Her final pathologic diagnosis is a Stage IA Grade 1 endometrioid endometrial cancer with no lymphovascular space invasion, no myometrial invasion, this was incidentally found. The right ovary contained a serous low malignant potential tumor with no surface involvement. Washings were  positive.  Interval Hx: She is seen today for a routine surveillance. She is doing well. She has no complaints including no bleeding or pelvic pain. She has intermittent constipation.  CA 125 on 07/13/15 was normal at 5. CA 125 on 07/11/16 was normal at 6.2 Pap with provider Derrek Monaco was normal with negative hr HPV in January, 2019.  CA 125 on 07/14/17 was normal at 5.6 CA 125 on 08/10/19 was normal at 4.8. CA 125 on 07/23/19 was normal at 4.8.   Past Medical History:  Diagnosis Date  . Arthritis   . Diabetes mellitus without complication ([place])   . History of endometrial cancer 01/05/2016  . Hyperlipidemia   . Hypertension   . Overactive bladder   . PMB (postmenopausal bleeding) 11/18/2014  . Thyroid disease    Past Surgical History:  Procedure Laterality Date  . COLONOSCOPY    . COLONOSCOPY N/A 04/09/2015   Procedure: COLONOSCOPY;  Surgeon: Rogene Houston, MD;  [facility];  Service: Endoscopy;  Laterality: N/A;  1030  . COLONOSCOPY N/A 08/27/2018   Procedure: COLONOSCOPY;  Surgeon: Rogene Houston, MD;  [facility];  Service: Endoscopy;  Laterality: N/A;  8:30  . CYSTOSCOPY    . POLYPECTOMY  04/09/2015   Procedure: POLYPECTOMY;  Surgeon: Rogene Houston, MD;  [facility];  Service: Endoscopy;;  Ascending colon polyp removed via hot snare  . POLYPECTOMY  08/27/2018   Procedure: POLYPECTOMY;  Surgeon: Rogene Houston, MD;  [facility];  Service: Endoscopy;;  colon  . ROBOTIC ASSISTED TOTAL  HYSTERECTOMY WITH BILATERAL SALPINGO OOPHERECTOMY Bilateral 12/18/2014   Procedure: XI ROBOTIC ASSISTED TOTAL HYSTERECTOMY WITH BILATERAL SALPINGO OOPHORECTOMY;  Surgeon: Everitt Amber, MD;  Location: [facility];  Service: Gynecology;  Laterality: Bilateral;   Family History  Problem Relation Age of Onset  . Congestive Heart Failure Mother   . Arthritis Father   . Other Father        killed in [place]  . Other Sister        brain injury after fall  .  Cancer Sister        multiple mylemoma  . Obesity Daughter   . Diabetes Maternal Grandmother   . Parkinson's disease Paternal Grandfather    Social History   Socioeconomic History  . Marital status: Married    Spouse name: Not on file  . Number of children: Not on file  . Years of education: Not on file  . Highest education level: Not on file  Occupational History  . Not on file  Tobacco Use  . Smoking status: Former Smoker    Packs/day: 2.00    Years: 36.00    Pack years: 72.00    Types: Cigarettes    Start date: 03/03/1968    Quit date: 10/27/2004    Years since quitting: 15.2  . Smokeless tobacco: Never Used  Vaping Use  . Vaping Use: Never used  Substance and Sexual Activity  . Alcohol use: No  . Drug use: No  . Sexual activity: Not Currently    Birth control/protection: Post-menopausal, Surgical    Comment: hyst  Other Topics Concern  . Not on file  Social History Narrative  . Not on file   Social Determinants of Health   Financial Resource Strain: Not on file  Food Insecurity: Not on file  Transportation Needs: Not on file  Physical Activity: Not on file  Stress: Not on file  Social Connections: Not on file  Intimate Partner Violence: Not on file   Current Outpatient Medications on File Prior to Visit  Medication Sig Dispense Refill  . acetaminophen (TYLENOL) 500 MG tablet Take 1,000 mg by mouth 2 (two) times daily.    Marland Kitchen amLODipine (NORVASC) 10 MG tablet Take 10 mg by mouth daily.    Marland Kitchen atorvastatin (LIPITOR) 40 MG tablet Take 1 tablet (40 mg total) by mouth daily. 90 tablet 3  . chlorthalidone (HYGROTON) 25 MG tablet Take 0.5 tablets (12.5 mg total) by mouth daily. 45 tablet 3  . cholecalciferol (VITAMIN D3) 25 MCG (1000 UT) tablet Take 1,000 Units by mouth daily at 12 noon.    Marland Kitchen levothyroxine (SYNTHROID) 125 MCG tablet TAKE 1 TABLET BY MOUTH EVERY DAY BEFORE BREAKFAST 90 tablet 1  . losartan (COZAAR) 100 MG tablet Take 1 tablet (100 mg total) by mouth  daily. 90 tablet 1  . metFORMIN (GLUCOPHAGE-XR) 500 MG 24 hr tablet TAKE 1 TABLET BY MOUTH EVERY DAY WITH BREAKFAST 90 tablet 1  . Multiple Vitamins-Minerals (CENTRUM SILVER PO) Take 1 tablet by mouth every evening.      No current facility-administered medications on file prior to visit.   No Known Allergies   Review of systems: Constitutional:  She has no weight gain or weight loss. She has no fever or chills. Eyes: No blurred vision Ears, Nose, Mouth, Throat: No dizziness, headaches or changes in hearing. No mouth sores. Cardiovascular: No chest pain, palpitations or edema. Respiratory:  No shortness of breath, wheezing or cough Gastrointestinal: She has normal bowel movements without diarrhea or constipation.  She denies any nausea or vomiting. She denies blood in her stool or heart burn. Genitourinary:  She denies pelvic pain, pelvic pressure or changes in her urinary function. She has no hematuria, dysuria, or incontinence. No vaginal bleeding and vaginal discharge Musculoskeletal: + generalized arthritis pain Skin:  She has no skin changes, rashes or itching Neurological:  Denies dizziness or headaches. No neuropathy, no numbness or tingling. Psychiatric:  She denies depression or anxiety. Hematologic/Lymphatic:   No easy bruising or bleeding   Physical Exam: Blood pressure (!) 109/95, pulse (!) 107, temperature 98.1 F (36.7 C), temperature source Tympanic, resp. rate 20, height 5\' 8"  (1.727 m), weight 269 lb (122 kg), SpO2 97 %. General: Well dressed, well nourished in no apparent distress.   HEENT:  Normocephalic and atraumatic, no lesions.  Extraocular muscles intact. Sclerae anicteric. Pupils equal, round, reactive. No mouth sores or ulcers. Thyroid is normal size, not nodular, midline. Abdomen:  Soft, nontender, nondistended.  No palpable masses.  No hepatosplenomegaly.  No ascites. Normal bowel sounds.  Small umbilical hernia - no incarceration or strangulation.  Incisions  are soft. Genitourinary: Normal EGBUS  Vaginal cuff intact.  No bleeding or discharge.  Limited exam due to long vagina and narrow upper vagina. No cul de sac fullness. Extremities: No cyanosis, clubbing or edema.  No calf tenderness or erythema. No palpable cords. Psychiatric: Mood and affect are appropriate. Neurological: Awake, alert and oriented x 3. Sensation is intact, no neuropathy.  Musculoskeletal: No pain, normal strength and range of motion.   Thereasa Solo, MD

## 2020-01-28 ENCOUNTER — Other Ambulatory Visit: Payer: Self-pay | Admitting: Gynecologic Oncology

## 2020-01-28 DIAGNOSIS — D3911 Neoplasm of uncertain behavior of right ovary: Secondary | ICD-10-CM

## 2020-01-28 NOTE — Progress Notes (Signed)
Ordered CA 125 for surveillance of ovarian tumor.

## 2020-01-29 ENCOUNTER — Inpatient Hospital Stay: Payer: Medicare Other

## 2020-01-29 ENCOUNTER — Inpatient Hospital Stay: Payer: Medicare Other | Attending: Gynecologic Oncology | Admitting: Gynecologic Oncology

## 2020-01-29 ENCOUNTER — Other Ambulatory Visit: Payer: Self-pay

## 2020-01-29 ENCOUNTER — Encounter: Payer: Self-pay | Admitting: Gynecologic Oncology

## 2020-01-29 VITALS — BP 109/95 | HR 107 | Temp 98.1°F | Resp 20 | Ht 68.0 in | Wt 269.0 lb

## 2020-01-29 DIAGNOSIS — E119 Type 2 diabetes mellitus without complications: Secondary | ICD-10-CM | POA: Diagnosis not present

## 2020-01-29 DIAGNOSIS — Z7984 Long term (current) use of oral hypoglycemic drugs: Secondary | ICD-10-CM | POA: Insufficient documentation

## 2020-01-29 DIAGNOSIS — D3911 Neoplasm of uncertain behavior of right ovary: Secondary | ICD-10-CM | POA: Insufficient documentation

## 2020-01-29 DIAGNOSIS — Z90722 Acquired absence of ovaries, bilateral: Secondary | ICD-10-CM | POA: Insufficient documentation

## 2020-01-29 DIAGNOSIS — M1990 Unspecified osteoarthritis, unspecified site: Secondary | ICD-10-CM | POA: Diagnosis not present

## 2020-01-29 DIAGNOSIS — Z90710 Acquired absence of both cervix and uterus: Secondary | ICD-10-CM | POA: Diagnosis not present

## 2020-01-29 DIAGNOSIS — E785 Hyperlipidemia, unspecified: Secondary | ICD-10-CM | POA: Diagnosis not present

## 2020-01-29 DIAGNOSIS — Z87891 Personal history of nicotine dependence: Secondary | ICD-10-CM | POA: Diagnosis not present

## 2020-01-29 DIAGNOSIS — E079 Disorder of thyroid, unspecified: Secondary | ICD-10-CM | POA: Insufficient documentation

## 2020-01-29 DIAGNOSIS — Z79899 Other long term (current) drug therapy: Secondary | ICD-10-CM | POA: Insufficient documentation

## 2020-01-29 DIAGNOSIS — I10 Essential (primary) hypertension: Secondary | ICD-10-CM | POA: Diagnosis not present

## 2020-01-29 NOTE — Patient Instructions (Signed)
Dr Denman George recommends the following urologic surgeons for your bladder issues:  Dr Nicki Reaper McDiarmid Dr Arnette Schaumann Dr Louis Meckel All are at [facility] - (ph) [phone]   Provided that you remain symptom free from recurrence, you do not need to schedule to return to see Dr Denman George, however, please call her office at [phone] if you have symptoms in the future that are worrisome for recurrence.

## 2020-01-30 LAB — CA 125: Cancer Antigen (CA) 125: 4.9 U/mL (ref 0.0–38.1)

## 2020-01-31 ENCOUNTER — Telehealth: Payer: Self-pay

## 2020-01-31 NOTE — Telephone Encounter (Signed)
Sent Pt a MyChart message to Ms Wedel with results.

## 2020-03-26 DIAGNOSIS — E119 Type 2 diabetes mellitus without complications: Secondary | ICD-10-CM | POA: Diagnosis not present

## 2020-03-26 DIAGNOSIS — I10 Essential (primary) hypertension: Secondary | ICD-10-CM | POA: Diagnosis not present

## 2020-03-26 DIAGNOSIS — E7849 Other hyperlipidemia: Secondary | ICD-10-CM | POA: Diagnosis not present

## 2020-03-26 DIAGNOSIS — E782 Mixed hyperlipidemia: Secondary | ICD-10-CM | POA: Diagnosis not present

## 2020-04-01 DIAGNOSIS — Z6839 Body mass index (BMI) 39.0-39.9, adult: Secondary | ICD-10-CM | POA: Diagnosis not present

## 2020-04-01 DIAGNOSIS — G4733 Obstructive sleep apnea (adult) (pediatric): Secondary | ICD-10-CM | POA: Diagnosis not present

## 2020-04-01 DIAGNOSIS — K219 Gastro-esophageal reflux disease without esophagitis: Secondary | ICD-10-CM | POA: Diagnosis not present

## 2020-04-01 DIAGNOSIS — R5382 Chronic fatigue, unspecified: Secondary | ICD-10-CM | POA: Diagnosis not present

## 2020-04-01 DIAGNOSIS — R350 Frequency of micturition: Secondary | ICD-10-CM | POA: Diagnosis not present

## 2020-04-01 DIAGNOSIS — E7849 Other hyperlipidemia: Secondary | ICD-10-CM | POA: Diagnosis not present

## 2020-04-01 DIAGNOSIS — I10 Essential (primary) hypertension: Secondary | ICD-10-CM | POA: Diagnosis not present

## 2020-04-01 DIAGNOSIS — R202 Paresthesia of skin: Secondary | ICD-10-CM | POA: Diagnosis not present

## 2020-04-03 ENCOUNTER — Ambulatory Visit: Payer: Medicare Other | Admitting: "Endocrinology

## 2020-04-06 ENCOUNTER — Telehealth: Payer: Self-pay | Admitting: Family Medicine

## 2020-04-06 ENCOUNTER — Encounter: Payer: Self-pay | Admitting: *Deleted

## 2020-04-06 NOTE — Telephone Encounter (Signed)
Patient called stating that she saw Amy Boyd,PCP and was told that might need to go back on a statin drug.

## 2020-04-06 NOTE — Telephone Encounter (Signed)
Awaiting call back from patient to advise that most recent lab work was requested from PCP and also to confirm that she has been taking atorvastatin 40 mg daily or if she has been off of it.

## 2020-04-08 NOTE — Telephone Encounter (Signed)
Reports that she never started atorvastatin back in December 2020 but did start atorvastatin 40 mg daily 3 days ago. Advised that this was recommended by [facility] and that she should be on it. Verbalized understanding.

## 2020-04-09 DIAGNOSIS — E038 Other specified hypothyroidism: Secondary | ICD-10-CM | POA: Diagnosis not present

## 2020-04-09 DIAGNOSIS — E782 Mixed hyperlipidemia: Secondary | ICD-10-CM | POA: Diagnosis not present

## 2020-04-09 DIAGNOSIS — E559 Vitamin D deficiency, unspecified: Secondary | ICD-10-CM | POA: Diagnosis not present

## 2020-04-10 ENCOUNTER — Telehealth: Payer: Self-pay | Admitting: *Deleted

## 2020-04-10 ENCOUNTER — Encounter: Payer: Self-pay | Admitting: "Endocrinology

## 2020-04-10 ENCOUNTER — Ambulatory Visit (INDEPENDENT_AMBULATORY_CARE_PROVIDER_SITE_OTHER): Payer: Medicare Other | Admitting: "Endocrinology

## 2020-04-10 ENCOUNTER — Other Ambulatory Visit: Payer: Self-pay

## 2020-04-10 VITALS — BP 142/76 | HR 88 | Ht 68.0 in | Wt 270.4 lb

## 2020-04-10 DIAGNOSIS — E782 Mixed hyperlipidemia: Secondary | ICD-10-CM | POA: Diagnosis not present

## 2020-04-10 DIAGNOSIS — E038 Other specified hypothyroidism: Secondary | ICD-10-CM

## 2020-04-10 DIAGNOSIS — E118 Type 2 diabetes mellitus with unspecified complications: Secondary | ICD-10-CM

## 2020-04-10 LAB — LIPID PANEL
Chol/HDL Ratio: 3.5 ratio (ref 0.0–4.4)
Cholesterol, Total: 150 mg/dL (ref 100–199)
HDL: 43 mg/dL (ref >39)
LDL Chol Calc (NIH): 85 mg/dL (ref 0–99)
Triglycerides: 120 mg/dL (ref 0–149)
VLDL Cholesterol Cal: 22 mg/dL (ref 5–40)

## 2020-04-10 LAB — POCT GLYCOSYLATED HEMOGLOBIN (HGB A1C): HbA1c, POC (controlled diabetic range): 6.8 % (ref 0.0–7.0)

## 2020-04-10 LAB — TSH: TSH: 2.11 u[IU]/mL (ref 0.450–4.500)

## 2020-04-10 LAB — VITAMIN D 25 HYDROXY (VIT D DEFICIENCY, FRACTURES): Vit D, 25-Hydroxy: 45.3 ng/mL (ref 30.0–100.0)

## 2020-04-10 LAB — T4, FREE: Free T4: 1.53 ng/dL (ref 0.82–1.77)

## 2020-04-10 NOTE — Telephone Encounter (Signed)
-----   Message from Verta Ellen., NP sent at 04/09/2020  9:01 AM EDT ----- Recent lab work from [facility] shows her LDL is elevated at 113.  It was previously 28.  Please call her and make sure she is taking her atorvastatin as directed.  Also if she is taking the atorvastatin as directed, ask her if she would be willing to start Zetia 10 mg daily to help with the cholesterol.  Her blood sugar was elevated at 141 which has improved from previous sugar level.  Thanks

## 2020-04-10 NOTE — Patient Instructions (Signed)

## 2020-04-10 NOTE — Progress Notes (Signed)
04/10/2020    Endocrinology follow-up note  Subjective:    Patient ID: Hannah Barrett, female    DOB: 05/11/1950, PCP Jalene Mullet, PA-C   Past Medical History:  Diagnosis Date  . Arthritis   . Diabetes mellitus without complication ([place])   . History of endometrial cancer 01/05/2016  . Hyperlipidemia   . Hypertension   . Overactive bladder   . PMB (postmenopausal bleeding) 11/18/2014  . Thyroid disease    Past Surgical History:  Procedure Laterality Date  . COLONOSCOPY    . COLONOSCOPY N/A 04/09/2015   Procedure: COLONOSCOPY;  Surgeon: Rogene Houston, MD;  [facility];  Service: Endoscopy;  Laterality: N/A;  1030  . COLONOSCOPY N/A 08/27/2018   Procedure: COLONOSCOPY;  Surgeon: Rogene Houston, MD;  [facility];  Service: Endoscopy;  Laterality: N/A;  8:30  . CYSTOSCOPY    . POLYPECTOMY  04/09/2015   Procedure: POLYPECTOMY;  Surgeon: Rogene Houston, MD;  [facility];  Service: Endoscopy;;  Ascending colon polyp removed via hot snare  . POLYPECTOMY  08/27/2018   Procedure: POLYPECTOMY;  Surgeon: Rogene Houston, MD;  [facility];  Service: Endoscopy;;  colon  . ROBOTIC ASSISTED TOTAL HYSTERECTOMY WITH BILATERAL SALPINGO OOPHERECTOMY Bilateral 12/18/2014   Procedure: XI ROBOTIC ASSISTED TOTAL HYSTERECTOMY WITH BILATERAL SALPINGO OOPHORECTOMY;  Surgeon: Everitt Amber, MD;  Location: [facility];  Service: Gynecology;  Laterality: Bilateral;   Social History   Socioeconomic History  . Marital status: Married    Spouse name: Not on file  . Number of children: Not on file  . Years of education: Not on file  . Highest education level: Not on file  Occupational History  . Not on file  Tobacco Use  . Smoking status: Former Smoker    Packs/day: 2.00    Years: 36.00    Pack years: 72.00    Types: Cigarettes    Start date: 03/03/1968    Quit date: 10/27/2004    Years since quitting: 15.4  . Smokeless tobacco: Never Used  Vaping Use  .  Vaping Use: Never used  Substance and Sexual Activity  . Alcohol use: No  . Drug use: No  . Sexual activity: Not Currently    Birth control/protection: Post-menopausal, Surgical    Comment: hyst  Other Topics Concern  . Not on file  Social History Narrative  . Not on file   Social Determinants of Health   Financial Resource Strain: Not on file  Food Insecurity: Not on file  Transportation Needs: Not on file  Physical Activity: Not on file  Stress: Not on file  Social Connections: Not on file   Outpatient Encounter Medications as of 04/10/2020  Medication Sig  . acetaminophen (TYLENOL) 500 MG tablet Take 1,000 mg by mouth 2 (two) times daily.  Marland Kitchen amLODipine (NORVASC) 10 MG tablet Take 10 mg by mouth daily.  Marland Kitchen atorvastatin (LIPITOR) 40 MG tablet Take 1 tablet (40 mg total) by mouth daily.  . chlorthalidone (HYGROTON) 25 MG tablet Take 0.5 tablets (12.5 mg total) by mouth daily.  . cholecalciferol (VITAMIN D3) 25 MCG (1000 UT) tablet Take 1,000 Units by mouth daily at 12 noon.  Marland Kitchen levothyroxine (SYNTHROID) 125 MCG tablet TAKE 1 TABLET BY MOUTH EVERY DAY BEFORE BREAKFAST  . losartan (COZAAR) 100 MG tablet Take 1 tablet (100 mg total) by mouth daily.  . metFORMIN (GLUCOPHAGE-XR) 500 MG 24 hr tablet TAKE 1 TABLET BY MOUTH EVERY DAY  WITH BREAKFAST  . Multiple Vitamins-Minerals (CENTRUM SILVER PO) Take 1 tablet by mouth every evening.    No facility-administered encounter medications on file as of 04/10/2020.   ALLERGIES: No Known Allergies VACCINATION STATUS:  There is no immunization history on file for this patient.  70 yr old with hx of RAI induced hypothyroidism, and controlled type 2 DM .  She is being engaged in telehealth via telephone for follow-up of her hypothyroidism-RAI induced, here for follow-up for RAI induced hypothyroidism, as well as type 2 diabetes.  She is currently on levothyroxine 125 mg p.o. daily before breakfast, Metformin 500 mg XR daily at breakfast.   She is  compliant, has no new complaints.  She denies heat intolerance, palpitations, and tremors.  She has a steady weight since last visit. -Her point-of-care A1c 6.8%, improving from 7.2%.  She continues to tolerate Metformin.    Review of Systems Limited as above.  Objective:    BP (!) 142/76   Pulse 88   Ht 5\' 8"  (1.727 m)   Wt 270 lb 6.4 oz (122.7 kg)   BMI 41.11 kg/m   Wt Readings from Last 3 Encounters:  04/10/20 270 lb 6.4 oz (122.7 kg)  01/29/20 269 lb (122 kg)  01/08/20 272 lb 12.8 oz (123.7 kg)     Results for orders placed or performed in visit on 04/10/20  HgB A1c  Result Value Ref Range   Hemoglobin A1C     HbA1c POC (<> result, manual entry)     HbA1c, POC (prediabetic range)     HbA1c, POC (controlled diabetic range) 6.8 0.0 - 7.0 %   Complete Blood Count (Most recent): Lab Results  Component Value Date   WBC 12.6 (H) 12/19/2014   HGB 11.1 (L) 12/19/2014   HCT 34.8 (L) 12/19/2014   MCV 93.5 12/19/2014   PLT 260 12/19/2014   Chemistry (most recent): Lab Results  Component Value Date   NA 142 10/01/2019   K 3.9 10/01/2019   CL 105 10/01/2019   CO2 27 10/01/2019   BUN 12 10/01/2019   CREATININE 0.57 10/01/2019   Diabetic Labs (most recent): Lab Results  Component Value Date   HGBA1C 6.8 04/10/2020   HGBA1C 7.2 03/25/2019   HGBA1C 6.7 03/19/2018    Lipid Panel     Component Value Date/Time   CHOL 150 04/09/2020 1015   TRIG 120 04/09/2020 1015   HDL 43 04/09/2020 1015   CHOLHDL 3.5 04/09/2020 1015   LDLCALC 85 04/09/2020 1015   LABVLDL 22 04/09/2020 1015     Assessment & Plan:   1. RAI  hypothyroidism:  -Her thyroid function tests are consistent with appropriate replacement.  She is advised to continue levothyroxine 125 mcg p.o. daily before breakfast.     - We discussed about the correct intake of her thyroid hormone, on empty stomach at fasting, with water, separated by at least 30 minutes from breakfast and other medications,  and  separated by more than 4 hours from calcium, iron, multivitamins, acid reflux medications (PPIs). -Patient is made aware of the fact that thyroid hormone replacement is needed for life, dose to be adjusted by periodic monitoring of thyroid function tests.   2. Type 2 diabetes mellitus with complication - Her point-of-care A1c 6.8%, improving from 7.2%.   -She is tolerating her metformin.  She is advised to continue Metformin 500 mg extended release daily after breakfast.     she is following with Jearld Fenton, CDE for DM  education.  - she acknowledges that there is a room for improvement in her food and drink choices. - Suggestion is made for her to avoid simple carbohydrates  from her diet including Cakes, Sweet Desserts, Ice Cream, Soda (diet and regular), Sweet Tea, Candies, Chips, Cookies, Store Bought Juices, Alcohol in Excess of  1-2 drinks a day, Artificial Sweeteners,  Coffee Creamer, and "Sugar-free" Products, Lemonade. This will help patient to have more stable blood glucose profile and potentially avoid unintended weight gain.    -She has responded to statin treatment with improvement of her LDL at 85.  She is advised to continue atorvastatin 40 mg p.o. nightly.  Side effects and precautions discussed with her.     She previously declined an offer of Qsymia for weight control. she has no contraindications to its use.   3. Essential hypertension -Her blood pressure is controlled to near target levels.  She is following with her cardiologist who is adjusting her losartan to 100 mg p.o. daily, along with amlodipine 5 mg p.o. daily.     4. Osteoporosis:   she has decided to discontinue Alendronate because a family member has had side effects from it. She states that her primary care doctor's office DEXA scan was "normal".  - I advised patient to maintain close follow up with her PCP for primary care needs.     - Time spent on this patient care encounter:  30 minutes of which 50%  was spent in  counseling and the rest reviewing  her current and  previous labs / studies and medications  doses and developing a plan for long term care, and documenting this care. Hannah Barrett  participated in the discussions, expressed understanding, and voiced agreement with the above plans.  All questions were answered to her satisfaction. she is encouraged to contact clinic should she have any questions or concerns prior to her return visit.  Follow up plan: Return in about 6 months (around 10/11/2020) for F/U with Pre-visit Labs, A1c -NV.  Glade Lloyd, MD Phone: [phone]  Fax: [phone]  This note was partially dictated with voice recognition software. Similar sounding words can be transcribed inadequately or may not  be corrected upon review.  04/10/2020, 12:52 PM

## 2020-04-10 NOTE — Telephone Encounter (Signed)
Laurine Blazer, LPN  0/22/3361 2:24 PM EDT Back to Top     Patient notified. Stated that she has only been back on the Atorvastatin 40mg  daily for less than a month. She will continue to have this managed by her pcp. Also, had good visit with Dr. Dorris Fetch today.

## 2020-05-25 DIAGNOSIS — Z1379 Encounter for other screening for genetic and chromosomal anomalies: Secondary | ICD-10-CM | POA: Diagnosis not present

## 2020-05-25 DIAGNOSIS — E119 Type 2 diabetes mellitus without complications: Secondary | ICD-10-CM | POA: Diagnosis not present

## 2020-05-25 DIAGNOSIS — Z808 Family history of malignant neoplasm of other organs or systems: Secondary | ICD-10-CM | POA: Diagnosis not present

## 2020-05-25 DIAGNOSIS — Z8542 Personal history of malignant neoplasm of other parts of uterus: Secondary | ICD-10-CM | POA: Diagnosis not present

## 2020-05-25 DIAGNOSIS — Z803 Family history of malignant neoplasm of breast: Secondary | ICD-10-CM | POA: Diagnosis not present

## 2020-07-16 DIAGNOSIS — E039 Hypothyroidism, unspecified: Secondary | ICD-10-CM | POA: Diagnosis not present

## 2020-07-16 DIAGNOSIS — E7849 Other hyperlipidemia: Secondary | ICD-10-CM | POA: Diagnosis not present

## 2020-07-16 DIAGNOSIS — K219 Gastro-esophageal reflux disease without esophagitis: Secondary | ICD-10-CM | POA: Diagnosis not present

## 2020-07-16 DIAGNOSIS — I10 Essential (primary) hypertension: Secondary | ICD-10-CM | POA: Diagnosis not present

## 2020-07-28 DIAGNOSIS — E119 Type 2 diabetes mellitus without complications: Secondary | ICD-10-CM | POA: Diagnosis not present

## 2020-07-28 DIAGNOSIS — I10 Essential (primary) hypertension: Secondary | ICD-10-CM | POA: Diagnosis not present

## 2020-07-28 DIAGNOSIS — E7801 Familial hypercholesterolemia: Secondary | ICD-10-CM | POA: Diagnosis not present

## 2020-07-28 DIAGNOSIS — E7800 Pure hypercholesterolemia, unspecified: Secondary | ICD-10-CM | POA: Diagnosis not present

## 2020-07-29 DIAGNOSIS — G4733 Obstructive sleep apnea (adult) (pediatric): Secondary | ICD-10-CM | POA: Diagnosis not present

## 2020-07-29 DIAGNOSIS — R202 Paresthesia of skin: Secondary | ICD-10-CM | POA: Diagnosis not present

## 2020-07-29 DIAGNOSIS — I10 Essential (primary) hypertension: Secondary | ICD-10-CM | POA: Diagnosis not present

## 2020-07-29 DIAGNOSIS — M8580 Other specified disorders of bone density and structure, unspecified site: Secondary | ICD-10-CM | POA: Diagnosis not present

## 2020-07-29 DIAGNOSIS — R350 Frequency of micturition: Secondary | ICD-10-CM | POA: Diagnosis not present

## 2020-07-29 DIAGNOSIS — R5382 Chronic fatigue, unspecified: Secondary | ICD-10-CM | POA: Diagnosis not present

## 2020-07-29 DIAGNOSIS — E039 Hypothyroidism, unspecified: Secondary | ICD-10-CM | POA: Diagnosis not present

## 2020-07-29 DIAGNOSIS — E7849 Other hyperlipidemia: Secondary | ICD-10-CM | POA: Diagnosis not present

## 2020-08-03 ENCOUNTER — Other Ambulatory Visit: Payer: Self-pay | Admitting: "Endocrinology

## 2020-08-12 DIAGNOSIS — E119 Type 2 diabetes mellitus without complications: Secondary | ICD-10-CM | POA: Diagnosis not present

## 2020-08-12 DIAGNOSIS — R202 Paresthesia of skin: Secondary | ICD-10-CM | POA: Diagnosis not present

## 2020-08-12 DIAGNOSIS — R002 Palpitations: Secondary | ICD-10-CM | POA: Diagnosis not present

## 2020-08-12 DIAGNOSIS — I10 Essential (primary) hypertension: Secondary | ICD-10-CM | POA: Diagnosis not present

## 2020-08-12 DIAGNOSIS — Z0001 Encounter for general adult medical examination with abnormal findings: Secondary | ICD-10-CM | POA: Diagnosis not present

## 2020-08-12 DIAGNOSIS — M8580 Other specified disorders of bone density and structure, unspecified site: Secondary | ICD-10-CM | POA: Diagnosis not present

## 2020-08-12 DIAGNOSIS — F17210 Nicotine dependence, cigarettes, uncomplicated: Secondary | ICD-10-CM | POA: Diagnosis not present

## 2020-08-12 DIAGNOSIS — E7849 Other hyperlipidemia: Secondary | ICD-10-CM | POA: Diagnosis not present

## 2020-08-16 DIAGNOSIS — I10 Essential (primary) hypertension: Secondary | ICD-10-CM | POA: Diagnosis not present

## 2020-08-16 DIAGNOSIS — K219 Gastro-esophageal reflux disease without esophagitis: Secondary | ICD-10-CM | POA: Diagnosis not present

## 2020-08-16 DIAGNOSIS — E7849 Other hyperlipidemia: Secondary | ICD-10-CM | POA: Diagnosis not present

## 2020-08-16 DIAGNOSIS — E039 Hypothyroidism, unspecified: Secondary | ICD-10-CM | POA: Diagnosis not present

## 2020-08-31 DIAGNOSIS — I10 Essential (primary) hypertension: Secondary | ICD-10-CM | POA: Diagnosis not present

## 2020-08-31 DIAGNOSIS — Z23 Encounter for immunization: Secondary | ICD-10-CM | POA: Diagnosis not present

## 2020-08-31 DIAGNOSIS — K219 Gastro-esophageal reflux disease without esophagitis: Secondary | ICD-10-CM | POA: Diagnosis not present

## 2020-08-31 DIAGNOSIS — E119 Type 2 diabetes mellitus without complications: Secondary | ICD-10-CM | POA: Diagnosis not present

## 2020-10-12 ENCOUNTER — Ambulatory Visit: Payer: Medicare Other | Admitting: "Endocrinology

## 2020-10-12 ENCOUNTER — Other Ambulatory Visit: Payer: Self-pay

## 2020-10-12 DIAGNOSIS — E038 Other specified hypothyroidism: Secondary | ICD-10-CM

## 2020-10-13 LAB — T4, FREE: Free T4: 0.98 ng/dL (ref 0.82–1.77)

## 2020-10-13 LAB — TSH: TSH: 4.58 u[IU]/mL — ABNORMAL HIGH (ref 0.450–4.500)

## 2020-11-26 DIAGNOSIS — H524 Presbyopia: Secondary | ICD-10-CM | POA: Diagnosis not present

## 2020-11-26 DIAGNOSIS — E119 Type 2 diabetes mellitus without complications: Secondary | ICD-10-CM | POA: Diagnosis not present

## 2020-11-26 DIAGNOSIS — H52223 Regular astigmatism, bilateral: Secondary | ICD-10-CM | POA: Diagnosis not present

## 2020-11-26 DIAGNOSIS — H5203 Hypermetropia, bilateral: Secondary | ICD-10-CM | POA: Diagnosis not present

## 2020-11-26 DIAGNOSIS — H2513 Age-related nuclear cataract, bilateral: Secondary | ICD-10-CM | POA: Diagnosis not present

## 2020-11-26 LAB — HM DIABETES EYE EXAM

## 2020-12-14 DIAGNOSIS — E7800 Pure hypercholesterolemia, unspecified: Secondary | ICD-10-CM | POA: Diagnosis not present

## 2020-12-14 DIAGNOSIS — R5382 Chronic fatigue, unspecified: Secondary | ICD-10-CM | POA: Diagnosis not present

## 2020-12-14 DIAGNOSIS — E782 Mixed hyperlipidemia: Secondary | ICD-10-CM | POA: Diagnosis not present

## 2020-12-14 DIAGNOSIS — E7849 Other hyperlipidemia: Secondary | ICD-10-CM | POA: Diagnosis not present

## 2020-12-14 DIAGNOSIS — E7801 Familial hypercholesterolemia: Secondary | ICD-10-CM | POA: Diagnosis not present

## 2020-12-16 DIAGNOSIS — M8580 Other specified disorders of bone density and structure, unspecified site: Secondary | ICD-10-CM | POA: Diagnosis not present

## 2020-12-16 DIAGNOSIS — E039 Hypothyroidism, unspecified: Secondary | ICD-10-CM | POA: Diagnosis not present

## 2020-12-16 DIAGNOSIS — R202 Paresthesia of skin: Secondary | ICD-10-CM | POA: Diagnosis not present

## 2020-12-16 DIAGNOSIS — R350 Frequency of micturition: Secondary | ICD-10-CM | POA: Diagnosis not present

## 2020-12-16 DIAGNOSIS — E7849 Other hyperlipidemia: Secondary | ICD-10-CM | POA: Diagnosis not present

## 2020-12-16 DIAGNOSIS — R5382 Chronic fatigue, unspecified: Secondary | ICD-10-CM | POA: Diagnosis not present

## 2020-12-16 DIAGNOSIS — M1991 Primary osteoarthritis, unspecified site: Secondary | ICD-10-CM | POA: Diagnosis not present

## 2020-12-16 DIAGNOSIS — I10 Essential (primary) hypertension: Secondary | ICD-10-CM | POA: Diagnosis not present

## 2020-12-28 ENCOUNTER — Ambulatory Visit: Payer: Medicare Other | Admitting: Cardiology

## 2021-01-05 DIAGNOSIS — I10 Essential (primary) hypertension: Secondary | ICD-10-CM | POA: Diagnosis not present

## 2021-01-05 DIAGNOSIS — E782 Mixed hyperlipidemia: Secondary | ICD-10-CM | POA: Diagnosis not present

## 2021-01-05 DIAGNOSIS — Z23 Encounter for immunization: Secondary | ICD-10-CM | POA: Diagnosis not present

## 2021-01-05 DIAGNOSIS — Z6836 Body mass index (BMI) 36.0-36.9, adult: Secondary | ICD-10-CM | POA: Diagnosis not present

## 2021-01-05 DIAGNOSIS — R8290 Unspecified abnormal findings in urine: Secondary | ICD-10-CM | POA: Diagnosis not present

## 2021-01-05 DIAGNOSIS — E7849 Other hyperlipidemia: Secondary | ICD-10-CM | POA: Diagnosis not present

## 2021-01-05 DIAGNOSIS — R7989 Other specified abnormal findings of blood chemistry: Secondary | ICD-10-CM | POA: Diagnosis not present

## 2021-01-14 DIAGNOSIS — Z1231 Encounter for screening mammogram for malignant neoplasm of breast: Secondary | ICD-10-CM | POA: Diagnosis not present

## 2021-01-15 DIAGNOSIS — E7849 Other hyperlipidemia: Secondary | ICD-10-CM | POA: Diagnosis not present

## 2021-01-15 DIAGNOSIS — I10 Essential (primary) hypertension: Secondary | ICD-10-CM | POA: Diagnosis not present

## 2021-01-15 DIAGNOSIS — E039 Hypothyroidism, unspecified: Secondary | ICD-10-CM | POA: Diagnosis not present

## 2021-01-15 DIAGNOSIS — K219 Gastro-esophageal reflux disease without esophagitis: Secondary | ICD-10-CM | POA: Diagnosis not present

## 2021-01-25 ENCOUNTER — Ambulatory Visit (INDEPENDENT_AMBULATORY_CARE_PROVIDER_SITE_OTHER): Payer: Medicare Other | Admitting: Cardiology

## 2021-01-25 ENCOUNTER — Encounter: Payer: Self-pay | Admitting: Cardiology

## 2021-01-25 VITALS — BP 136/74 | HR 68 | Ht 68.0 in | Wt 247.0 lb

## 2021-01-25 DIAGNOSIS — I10 Essential (primary) hypertension: Secondary | ICD-10-CM

## 2021-01-25 DIAGNOSIS — E782 Mixed hyperlipidemia: Secondary | ICD-10-CM

## 2021-01-25 DIAGNOSIS — G4733 Obstructive sleep apnea (adult) (pediatric): Secondary | ICD-10-CM

## 2021-01-25 DIAGNOSIS — R002 Palpitations: Secondary | ICD-10-CM

## 2021-01-25 NOTE — Progress Notes (Signed)
Clinical Summary Hannah Barrett is a 71 y.o.female seen today for follow up of the following medical problems.    1. Palpitations  - 03/2016 event monitor without arrhytmias     - denies any recent palpitations.   2. HTN - cough on ACE-I. Changed to ARB by pcp.  - she stopped taking her losartan a few weeks ago, was confused whether she was supposed to be on   - she is compliant with meds    3. OSA - she is compliant with CPAP, has not been followed by a physician in sevearl years -has not been interested in establishing with a sleep medicine provider     4. DM2  - followed by pcp and endocrinology - last HgbA1c was 6.8  5. Hyperlipidmeia - she is on atorvastatin 40mg  daily, another provider had started zetia 10mg  daily.  - 03/2020 TC 150 TG 120 HDL 43 LDL 85    SH: husband followed at [facility], being considered for kidney transplant.    Past Medical History:  Diagnosis Date   Arthritis    Diabetes mellitus without complication ([place])    History of endometrial cancer 01/05/2016   Hyperlipidemia    Hypertension    Overactive bladder    PMB (postmenopausal bleeding) 11/18/2014   Thyroid disease      No Known Allergies   Current Outpatient Medications  Medication Sig Dispense Refill   acetaminophen (TYLENOL) 500 MG tablet Take 1,000 mg by mouth 2 (two) times daily.     amLODipine (NORVASC) 10 MG tablet Take 10 mg by mouth daily.     atorvastatin (LIPITOR) 40 MG tablet Take 1 tablet (40 mg total) by mouth daily. 90 tablet 3   chlorthalidone (HYGROTON) 25 MG tablet Take 0.5 tablets (12.5 mg total) by mouth daily. 45 tablet 3   cholecalciferol (VITAMIN D3) 25 MCG (1000 UT) tablet Take 1,000 Units by mouth daily at 12 noon.     levothyroxine (SYNTHROID) 125 MCG tablet TAKE 1 TABLET BY MOUTH EVERY DAY BEFORE BREAKFAST 90 tablet 0   losartan (COZAAR) 100 MG tablet Take 1 tablet (100 mg total) by mouth daily. 90 tablet 1   metFORMIN (GLUCOPHAGE-XR) 500 MG 24 hr tablet  TAKE 1 TABLET BY MOUTH EVERY DAY WITH BREAKFAST 90 tablet 0   Multiple Vitamins-Minerals (CENTRUM SILVER PO) Take 1 tablet by mouth every evening.      No current facility-administered medications for this visit.     Past Surgical History:  Procedure Laterality Date   COLONOSCOPY     COLONOSCOPY N/A 04/09/2015   Procedure: COLONOSCOPY;  Surgeon: Rogene Houston, MD;  Location: [facility];  Service: Endoscopy;  Laterality: N/A;  1030   COLONOSCOPY N/A 08/27/2018   Procedure: COLONOSCOPY;  Surgeon: Rogene Houston, MD;  Location: [facility];  Service: Endoscopy;  Laterality: N/A;  8:30   CYSTOSCOPY     POLYPECTOMY  04/09/2015   Procedure: POLYPECTOMY;  Surgeon: Rogene Houston, MD;  Location: [facility];  Service: Endoscopy;;  Ascending colon polyp removed via hot snare   POLYPECTOMY  08/27/2018   Procedure: POLYPECTOMY;  Surgeon: Rogene Houston, MD;  Location: [facility];  Service: Endoscopy;;  colon   ROBOTIC ASSISTED TOTAL HYSTERECTOMY WITH BILATERAL SALPINGO OOPHERECTOMY Bilateral 12/18/2014   Procedure: XI ROBOTIC ASSISTED TOTAL HYSTERECTOMY WITH BILATERAL SALPINGO OOPHORECTOMY;  Surgeon: Everitt Amber, MD;  Location: [facility];  Service: Gynecology;  Laterality: Bilateral;     No Known Allergies  Family History  Problem Relation Age of Onset   Congestive Heart Failure Mother    Arthritis Father    Other Father        killed in MVA   Other Sister        brain injury after fall   Cancer Sister        multiple mylemoma   Obesity Daughter    Diabetes Maternal Grandmother    Parkinson's disease Paternal Grandfather      Social History Hannah Barrett reports that she quit smoking about 16 years ago. Her smoking use included cigarettes. She started smoking about 52 years ago. She has a 72.00 pack-year smoking history. She has never used smokeless tobacco. Ms. Spindel reports no history of alcohol use.   Review of Systems CONSTITUTIONAL: No weight loss, fever, chills,  weakness or fatigue.  HEENT: Eyes: No visual loss, blurred vision, double vision or yellow sclerae.No hearing loss, sneezing, congestion, runny nose or sore throat.  SKIN: No rash or itching.  CARDIOVASCULAR: per hpi RESPIRATORY: No shortness of breath, cough or sputum.  GASTROINTESTINAL: No anorexia, nausea, vomiting or diarrhea. No abdominal pain or blood.  GENITOURINARY: No burning on urination, no polyuria NEUROLOGICAL: No headache, dizziness, syncope, paralysis, ataxia, numbness or tingling in the extremities. No change in bowel or bladder control.  MUSCULOSKELETAL: No muscle, back pain, joint pain or stiffness.  LYMPHATICS: No enlarged nodes. No history of splenectomy.  PSYCHIATRIC: No history of depression or anxiety.  ENDOCRINOLOGIC: No reports of sweating, cold or heat intolerance. No polyuria or polydipsia.  Hannah Barrett   Physical Examination Today's Vitals   01/25/21 0856  BP: 136/74  Pulse: 68  SpO2: 96%  Weight: 247 lb (112 kg)  Height: 5\' 8"  (1.727 m)   Body mass index is 37.56 kg/m.  Gen: resting comfortably, no acute distress HEENT: no scleral icterus, pupils equal round and reactive, no palptable cervical adenopathy,  CV: RRR, no mr/g no jvd Resp: Clear to auscultation bilaterally GI: abdomen is soft, non-tender, non-distended, normal bowel sounds, no hepatosplenomegaly MSK: extremities are warm, no edema.  Skin: warm, no rash Neuro:  no focal deficits Psych: appropriate affect   Diagnostic Studies  03/2016 echo Study Conclusions   - Left ventricle: The cavity size was normal. Wall thickness was   normal. Systolic function was normal. The estimated ejection   fraction was in the range of 60% to 65%. Wall motion was normal;   there were no regional wall motion abnormalities. Doppler   parameters are consistent with abnormal left ventricular   relaxation (grade 1 diastolic dysfunction).     03/2016 Cardiac monitor Telemetry tracings show normal sinus rhythm No  symptoms reported No significant arrhythmias   Assessment and Plan  1. Palpitations - benign cardiac monitor - denies recent symptoms, continue to monitor - EKG today shows NSR   2. HTN -essentially at goal, continue current meds   3. Hyperlipidemia - at goal, continue current meds    4. OSA - compliant with cpap, has not been interested in establishing with a sleep medicine provider. We did discuss her OSA and cpap should be monitored by a phsyician, we discussed [facility] in [place] if she becomes interested.   5.DM2 - glycemic control per endocrine - from cardiac standpoint she is on ARB, statin. Continue current therapy.   F/u 1 year  Arnoldo Lenis, M.D.

## 2021-01-25 NOTE — Patient Instructions (Signed)

## 2021-01-27 ENCOUNTER — Encounter: Payer: Self-pay | Admitting: *Deleted

## 2021-02-09 DIAGNOSIS — J189 Pneumonia, unspecified organism: Secondary | ICD-10-CM | POA: Diagnosis not present

## 2021-02-09 DIAGNOSIS — R079 Chest pain, unspecified: Secondary | ICD-10-CM | POA: Diagnosis not present

## 2021-02-09 DIAGNOSIS — M549 Dorsalgia, unspecified: Secondary | ICD-10-CM | POA: Diagnosis not present

## 2021-02-09 DIAGNOSIS — Z6834 Body mass index (BMI) 34.0-34.9, adult: Secondary | ICD-10-CM | POA: Diagnosis not present

## 2021-02-14 DIAGNOSIS — E7849 Other hyperlipidemia: Secondary | ICD-10-CM | POA: Diagnosis not present

## 2021-02-14 DIAGNOSIS — I10 Essential (primary) hypertension: Secondary | ICD-10-CM | POA: Diagnosis not present

## 2021-02-21 DIAGNOSIS — R0689 Other abnormalities of breathing: Secondary | ICD-10-CM | POA: Diagnosis not present

## 2021-02-21 DIAGNOSIS — F32A Depression, unspecified: Secondary | ICD-10-CM | POA: Diagnosis not present

## 2021-02-21 DIAGNOSIS — R55 Syncope and collapse: Secondary | ICD-10-CM | POA: Diagnosis not present

## 2021-02-21 DIAGNOSIS — W19XXXA Unspecified fall, initial encounter: Secondary | ICD-10-CM | POA: Diagnosis not present

## 2021-02-22 DIAGNOSIS — I700 Atherosclerosis of aorta: Secondary | ICD-10-CM | POA: Diagnosis not present

## 2021-02-22 DIAGNOSIS — K8020 Calculus of gallbladder without cholecystitis without obstruction: Secondary | ICD-10-CM | POA: Diagnosis not present

## 2021-02-22 DIAGNOSIS — J90 Pleural effusion, not elsewhere classified: Secondary | ICD-10-CM | POA: Diagnosis not present

## 2021-02-22 DIAGNOSIS — M899 Disorder of bone, unspecified: Secondary | ICD-10-CM | POA: Diagnosis not present

## 2021-02-22 DIAGNOSIS — R918 Other nonspecific abnormal finding of lung field: Secondary | ICD-10-CM | POA: Diagnosis not present

## 2021-02-22 DIAGNOSIS — I2510 Atherosclerotic heart disease of native coronary artery without angina pectoris: Secondary | ICD-10-CM | POA: Diagnosis not present

## 2021-02-22 DIAGNOSIS — I2584 Coronary atherosclerosis due to calcified coronary lesion: Secondary | ICD-10-CM | POA: Diagnosis not present

## 2021-02-23 ENCOUNTER — Ambulatory Visit: Payer: Medicare Other | Admitting: Cardiology

## 2021-02-24 ENCOUNTER — Other Ambulatory Visit: Payer: Self-pay | Admitting: General Practice

## 2021-02-24 ENCOUNTER — Other Ambulatory Visit (HOSPITAL_COMMUNITY): Payer: Self-pay | Admitting: General Practice

## 2021-02-24 DIAGNOSIS — R918 Other nonspecific abnormal finding of lung field: Secondary | ICD-10-CM

## 2021-02-25 DIAGNOSIS — R918 Other nonspecific abnormal finding of lung field: Secondary | ICD-10-CM | POA: Diagnosis not present

## 2021-02-25 DIAGNOSIS — C541 Malignant neoplasm of endometrium: Secondary | ICD-10-CM | POA: Diagnosis not present

## 2021-02-25 DIAGNOSIS — C561 Malignant neoplasm of right ovary: Secondary | ICD-10-CM | POA: Diagnosis not present

## 2021-03-01 DIAGNOSIS — C541 Malignant neoplasm of endometrium: Secondary | ICD-10-CM | POA: Diagnosis not present

## 2021-03-01 DIAGNOSIS — Z0389 Encounter for observation for other suspected diseases and conditions ruled out: Secondary | ICD-10-CM | POA: Diagnosis not present

## 2021-03-01 DIAGNOSIS — C561 Malignant neoplasm of right ovary: Secondary | ICD-10-CM | POA: Diagnosis not present

## 2021-03-01 DIAGNOSIS — R918 Other nonspecific abnormal finding of lung field: Secondary | ICD-10-CM | POA: Diagnosis not present

## 2021-03-04 ENCOUNTER — Other Ambulatory Visit (HOSPITAL_COMMUNITY): Payer: Self-pay | Admitting: Hematology and Oncology

## 2021-03-04 DIAGNOSIS — C541 Malignant neoplasm of endometrium: Secondary | ICD-10-CM

## 2021-03-08 DIAGNOSIS — R918 Other nonspecific abnormal finding of lung field: Secondary | ICD-10-CM | POA: Diagnosis not present

## 2021-03-08 DIAGNOSIS — C561 Malignant neoplasm of right ovary: Secondary | ICD-10-CM | POA: Diagnosis not present

## 2021-03-08 DIAGNOSIS — C541 Malignant neoplasm of endometrium: Secondary | ICD-10-CM | POA: Diagnosis not present

## 2021-03-08 DIAGNOSIS — C7951 Secondary malignant neoplasm of bone: Secondary | ICD-10-CM | POA: Diagnosis not present

## 2021-03-11 ENCOUNTER — Encounter (HOSPITAL_COMMUNITY)
Admission: RE | Admit: 2021-03-11 | Discharge: 2021-03-11 | Disposition: A | Discharge status: Home / Self Care | Payer: Medicare Other | Source: Ambulatory Visit | Attending: Hematology and Oncology | Admitting: Hematology and Oncology

## 2021-03-11 ENCOUNTER — Other Ambulatory Visit: Payer: Self-pay

## 2021-03-11 DIAGNOSIS — C541 Malignant neoplasm of endometrium: Secondary | ICD-10-CM | POA: Insufficient documentation

## 2021-03-11 DIAGNOSIS — C7951 Secondary malignant neoplasm of bone: Secondary | ICD-10-CM | POA: Diagnosis not present

## 2021-03-11 DIAGNOSIS — Z8542 Personal history of malignant neoplasm of other parts of uterus: Secondary | ICD-10-CM | POA: Diagnosis not present

## 2021-03-11 MED ORDER — FLUDEOXYGLUCOSE F - 18 (FDG) INJECTION
13.3800 | Freq: Once | INTRAVENOUS | Status: AC | PRN
  Administered 2021-03-11: 13.38 via INTRAVENOUS

## 2021-03-15 DIAGNOSIS — Z20822 Contact with and (suspected) exposure to covid-19: Secondary | ICD-10-CM | POA: Diagnosis not present

## 2021-03-19 DIAGNOSIS — E119 Type 2 diabetes mellitus without complications: Secondary | ICD-10-CM | POA: Diagnosis not present

## 2021-03-19 DIAGNOSIS — J939 Pneumothorax, unspecified: Secondary | ICD-10-CM | POA: Diagnosis not present

## 2021-03-19 DIAGNOSIS — R911 Solitary pulmonary nodule: Secondary | ICD-10-CM | POA: Diagnosis not present

## 2021-03-19 DIAGNOSIS — I10 Essential (primary) hypertension: Secondary | ICD-10-CM | POA: Diagnosis not present

## 2021-03-19 DIAGNOSIS — C561 Malignant neoplasm of right ovary: Secondary | ICD-10-CM | POA: Diagnosis not present

## 2021-03-19 DIAGNOSIS — R918 Other nonspecific abnormal finding of lung field: Secondary | ICD-10-CM | POA: Diagnosis not present

## 2021-03-19 DIAGNOSIS — C541 Malignant neoplasm of endometrium: Secondary | ICD-10-CM | POA: Diagnosis not present

## 2021-03-19 DIAGNOSIS — R0602 Shortness of breath: Secondary | ICD-10-CM | POA: Diagnosis not present

## 2021-03-19 DIAGNOSIS — E785 Hyperlipidemia, unspecified: Secondary | ICD-10-CM | POA: Diagnosis not present

## 2021-04-03 DIAGNOSIS — Z20822 Contact with and (suspected) exposure to covid-19: Secondary | ICD-10-CM | POA: Diagnosis not present

## 2021-04-14 DIAGNOSIS — C3412 Malignant neoplasm of upper lobe, left bronchus or lung: Secondary | ICD-10-CM | POA: Diagnosis not present

## 2021-04-14 DIAGNOSIS — E669 Obesity, unspecified: Secondary | ICD-10-CM | POA: Diagnosis not present

## 2021-04-14 DIAGNOSIS — L89301 Pressure ulcer of unspecified buttock, stage 1: Secondary | ICD-10-CM | POA: Diagnosis not present

## 2021-04-14 DIAGNOSIS — J154 Pneumonia due to other streptococci: Secondary | ICD-10-CM | POA: Diagnosis not present

## 2021-04-14 DIAGNOSIS — R5381 Other malaise: Secondary | ICD-10-CM | POA: Diagnosis not present

## 2021-04-14 DIAGNOSIS — Z803 Family history of malignant neoplasm of breast: Secondary | ICD-10-CM | POA: Diagnosis not present

## 2021-04-14 DIAGNOSIS — Z20822 Contact with and (suspected) exposure to covid-19: Secondary | ICD-10-CM | POA: Diagnosis not present

## 2021-04-14 DIAGNOSIS — F32A Depression, unspecified: Secondary | ICD-10-CM | POA: Diagnosis not present

## 2021-04-14 DIAGNOSIS — G4733 Obstructive sleep apnea (adult) (pediatric): Secondary | ICD-10-CM | POA: Diagnosis not present

## 2021-04-14 DIAGNOSIS — R4182 Altered mental status, unspecified: Secondary | ICD-10-CM | POA: Diagnosis not present

## 2021-04-14 DIAGNOSIS — E785 Hyperlipidemia, unspecified: Secondary | ICD-10-CM | POA: Diagnosis not present

## 2021-04-14 DIAGNOSIS — L89222 Pressure ulcer of left hip, stage 2: Secondary | ICD-10-CM | POA: Diagnosis not present

## 2021-04-14 DIAGNOSIS — L8942 Pressure ulcer of contiguous site of back, buttock and hip, stage 2: Secondary | ICD-10-CM | POA: Diagnosis not present

## 2021-04-14 DIAGNOSIS — R627 Adult failure to thrive: Secondary | ICD-10-CM | POA: Diagnosis not present

## 2021-04-14 DIAGNOSIS — Z6833 Body mass index (BMI) 33.0-33.9, adult: Secondary | ICD-10-CM | POA: Diagnosis not present

## 2021-04-14 DIAGNOSIS — Z87891 Personal history of nicotine dependence: Secondary | ICD-10-CM | POA: Diagnosis not present

## 2021-04-14 DIAGNOSIS — L89152 Pressure ulcer of sacral region, stage 2: Secondary | ICD-10-CM | POA: Diagnosis not present

## 2021-04-14 DIAGNOSIS — C541 Malignant neoplasm of endometrium: Secondary | ICD-10-CM | POA: Diagnosis not present

## 2021-04-14 DIAGNOSIS — R109 Unspecified abdominal pain: Secondary | ICD-10-CM | POA: Diagnosis not present

## 2021-04-14 DIAGNOSIS — R079 Chest pain, unspecified: Secondary | ICD-10-CM | POA: Diagnosis not present

## 2021-04-14 DIAGNOSIS — R531 Weakness: Secondary | ICD-10-CM | POA: Diagnosis not present

## 2021-04-14 DIAGNOSIS — J156 Pneumonia due to other aerobic Gram-negative bacteria: Secondary | ICD-10-CM | POA: Diagnosis not present

## 2021-04-14 DIAGNOSIS — Z7984 Long term (current) use of oral hypoglycemic drugs: Secondary | ICD-10-CM | POA: Diagnosis not present

## 2021-04-14 DIAGNOSIS — Z8543 Personal history of malignant neoplasm of ovary: Secondary | ICD-10-CM | POA: Diagnosis not present

## 2021-04-14 DIAGNOSIS — L89229 Pressure ulcer of left hip, unspecified stage: Secondary | ICD-10-CM | POA: Diagnosis not present

## 2021-04-14 DIAGNOSIS — J189 Pneumonia, unspecified organism: Secondary | ICD-10-CM | POA: Diagnosis not present

## 2021-04-14 DIAGNOSIS — C569 Malignant neoplasm of unspecified ovary: Secondary | ICD-10-CM | POA: Diagnosis not present

## 2021-04-14 DIAGNOSIS — I10 Essential (primary) hypertension: Secondary | ICD-10-CM | POA: Diagnosis not present

## 2021-04-14 DIAGNOSIS — R140 Abdominal distension (gaseous): Secondary | ICD-10-CM | POA: Diagnosis not present

## 2021-04-14 DIAGNOSIS — R410 Disorientation, unspecified: Secondary | ICD-10-CM | POA: Diagnosis not present

## 2021-04-14 DIAGNOSIS — K6389 Other specified diseases of intestine: Secondary | ICD-10-CM | POA: Diagnosis not present

## 2021-04-14 DIAGNOSIS — R918 Other nonspecific abnormal finding of lung field: Secondary | ICD-10-CM | POA: Diagnosis not present

## 2021-04-14 DIAGNOSIS — E860 Dehydration: Secondary | ICD-10-CM | POA: Diagnosis not present

## 2021-04-14 DIAGNOSIS — L89311 Pressure ulcer of right buttock, stage 1: Secondary | ICD-10-CM | POA: Diagnosis not present

## 2021-04-14 DIAGNOSIS — K5981 Ogilvie syndrome: Secondary | ICD-10-CM | POA: Diagnosis not present

## 2021-04-14 DIAGNOSIS — E876 Hypokalemia: Secondary | ICD-10-CM | POA: Diagnosis not present

## 2021-04-14 DIAGNOSIS — Z792 Long term (current) use of antibiotics: Secondary | ICD-10-CM | POA: Diagnosis not present

## 2021-04-14 DIAGNOSIS — C3490 Malignant neoplasm of unspecified part of unspecified bronchus or lung: Secondary | ICD-10-CM | POA: Diagnosis not present

## 2021-04-14 DIAGNOSIS — E039 Hypothyroidism, unspecified: Secondary | ICD-10-CM | POA: Diagnosis not present

## 2021-04-14 DIAGNOSIS — E440 Moderate protein-calorie malnutrition: Secondary | ICD-10-CM | POA: Diagnosis not present

## 2021-04-14 DIAGNOSIS — R0989 Other specified symptoms and signs involving the circulatory and respiratory systems: Secondary | ICD-10-CM | POA: Diagnosis not present

## 2021-04-14 DIAGNOSIS — R590 Localized enlarged lymph nodes: Secondary | ICD-10-CM | POA: Diagnosis not present

## 2021-04-14 DIAGNOSIS — E119 Type 2 diabetes mellitus without complications: Secondary | ICD-10-CM | POA: Diagnosis not present

## 2021-04-14 DIAGNOSIS — F05 Delirium due to known physiological condition: Secondary | ICD-10-CM | POA: Diagnosis not present

## 2021-04-15 DIAGNOSIS — Z792 Long term (current) use of antibiotics: Secondary | ICD-10-CM | POA: Diagnosis not present

## 2021-04-15 DIAGNOSIS — F32A Depression, unspecified: Secondary | ICD-10-CM | POA: Diagnosis not present

## 2021-04-15 DIAGNOSIS — R918 Other nonspecific abnormal finding of lung field: Secondary | ICD-10-CM | POA: Diagnosis not present

## 2021-04-15 DIAGNOSIS — K6389 Other specified diseases of intestine: Secondary | ICD-10-CM | POA: Diagnosis not present

## 2021-04-15 DIAGNOSIS — E876 Hypokalemia: Secondary | ICD-10-CM | POA: Diagnosis not present

## 2021-04-15 DIAGNOSIS — R531 Weakness: Secondary | ICD-10-CM | POA: Diagnosis not present

## 2021-04-15 DIAGNOSIS — R410 Disorientation, unspecified: Secondary | ICD-10-CM | POA: Diagnosis not present

## 2021-04-15 DIAGNOSIS — J189 Pneumonia, unspecified organism: Secondary | ICD-10-CM | POA: Diagnosis not present

## 2021-04-15 DIAGNOSIS — R627 Adult failure to thrive: Secondary | ICD-10-CM | POA: Diagnosis not present

## 2021-04-16 DIAGNOSIS — C3490 Malignant neoplasm of unspecified part of unspecified bronchus or lung: Secondary | ICD-10-CM | POA: Diagnosis not present

## 2021-04-16 DIAGNOSIS — K6389 Other specified diseases of intestine: Secondary | ICD-10-CM | POA: Diagnosis not present

## 2021-04-17 DIAGNOSIS — R0989 Other specified symptoms and signs involving the circulatory and respiratory systems: Secondary | ICD-10-CM | POA: Diagnosis not present

## 2021-04-17 DIAGNOSIS — R410 Disorientation, unspecified: Secondary | ICD-10-CM | POA: Diagnosis not present

## 2021-04-17 DIAGNOSIS — J189 Pneumonia, unspecified organism: Secondary | ICD-10-CM | POA: Diagnosis not present

## 2021-04-17 DIAGNOSIS — K6389 Other specified diseases of intestine: Secondary | ICD-10-CM | POA: Diagnosis not present

## 2021-04-17 DIAGNOSIS — R918 Other nonspecific abnormal finding of lung field: Secondary | ICD-10-CM | POA: Diagnosis not present

## 2021-04-17 DIAGNOSIS — L89311 Pressure ulcer of right buttock, stage 1: Secondary | ICD-10-CM | POA: Diagnosis not present

## 2021-04-17 DIAGNOSIS — L89229 Pressure ulcer of left hip, unspecified stage: Secondary | ICD-10-CM | POA: Diagnosis not present

## 2021-04-18 DIAGNOSIS — R918 Other nonspecific abnormal finding of lung field: Secondary | ICD-10-CM | POA: Diagnosis not present

## 2021-04-18 DIAGNOSIS — L89311 Pressure ulcer of right buttock, stage 1: Secondary | ICD-10-CM | POA: Diagnosis not present

## 2021-04-18 DIAGNOSIS — J156 Pneumonia due to other aerobic Gram-negative bacteria: Secondary | ICD-10-CM | POA: Diagnosis not present

## 2021-04-18 DIAGNOSIS — R410 Disorientation, unspecified: Secondary | ICD-10-CM | POA: Diagnosis not present

## 2021-04-18 DIAGNOSIS — L89229 Pressure ulcer of left hip, unspecified stage: Secondary | ICD-10-CM | POA: Diagnosis not present

## 2021-04-18 DIAGNOSIS — K6389 Other specified diseases of intestine: Secondary | ICD-10-CM | POA: Diagnosis not present

## 2021-04-18 DIAGNOSIS — J189 Pneumonia, unspecified organism: Secondary | ICD-10-CM | POA: Diagnosis not present

## 2021-04-19 DIAGNOSIS — R410 Disorientation, unspecified: Secondary | ICD-10-CM | POA: Diagnosis not present

## 2021-04-19 DIAGNOSIS — J189 Pneumonia, unspecified organism: Secondary | ICD-10-CM | POA: Diagnosis not present

## 2021-04-19 DIAGNOSIS — L89301 Pressure ulcer of unspecified buttock, stage 1: Secondary | ICD-10-CM | POA: Diagnosis not present

## 2021-04-19 DIAGNOSIS — K6389 Other specified diseases of intestine: Secondary | ICD-10-CM | POA: Diagnosis not present

## 2021-04-19 DIAGNOSIS — L89222 Pressure ulcer of left hip, stage 2: Secondary | ICD-10-CM | POA: Diagnosis not present

## 2021-04-20 DIAGNOSIS — J189 Pneumonia, unspecified organism: Secondary | ICD-10-CM | POA: Diagnosis not present

## 2021-04-20 DIAGNOSIS — L89152 Pressure ulcer of sacral region, stage 2: Secondary | ICD-10-CM | POA: Diagnosis not present

## 2021-04-20 DIAGNOSIS — R627 Adult failure to thrive: Secondary | ICD-10-CM | POA: Diagnosis not present

## 2021-04-20 DIAGNOSIS — R918 Other nonspecific abnormal finding of lung field: Secondary | ICD-10-CM | POA: Diagnosis not present

## 2021-04-20 DIAGNOSIS — K6389 Other specified diseases of intestine: Secondary | ICD-10-CM | POA: Diagnosis not present

## 2021-04-20 DIAGNOSIS — Z8543 Personal history of malignant neoplasm of ovary: Secondary | ICD-10-CM | POA: Diagnosis not present

## 2021-04-20 DIAGNOSIS — R410 Disorientation, unspecified: Secondary | ICD-10-CM | POA: Diagnosis not present

## 2021-04-21 DIAGNOSIS — E440 Moderate protein-calorie malnutrition: Secondary | ICD-10-CM | POA: Diagnosis not present

## 2021-04-21 DIAGNOSIS — E876 Hypokalemia: Secondary | ICD-10-CM | POA: Diagnosis not present

## 2021-04-21 DIAGNOSIS — R627 Adult failure to thrive: Secondary | ICD-10-CM | POA: Diagnosis not present

## 2021-04-21 DIAGNOSIS — L89301 Pressure ulcer of unspecified buttock, stage 1: Secondary | ICD-10-CM | POA: Diagnosis not present

## 2021-04-21 DIAGNOSIS — R918 Other nonspecific abnormal finding of lung field: Secondary | ICD-10-CM | POA: Diagnosis not present

## 2021-04-21 DIAGNOSIS — C569 Malignant neoplasm of unspecified ovary: Secondary | ICD-10-CM | POA: Diagnosis not present

## 2021-04-21 DIAGNOSIS — L89222 Pressure ulcer of left hip, stage 2: Secondary | ICD-10-CM | POA: Diagnosis not present

## 2021-04-21 DIAGNOSIS — J189 Pneumonia, unspecified organism: Secondary | ICD-10-CM | POA: Diagnosis not present

## 2021-04-21 DIAGNOSIS — C541 Malignant neoplasm of endometrium: Secondary | ICD-10-CM | POA: Diagnosis not present

## 2021-04-21 DIAGNOSIS — K6389 Other specified diseases of intestine: Secondary | ICD-10-CM | POA: Diagnosis not present

## 2021-04-22 DIAGNOSIS — Z20822 Contact with and (suspected) exposure to covid-19: Secondary | ICD-10-CM | POA: Diagnosis not present

## 2021-04-27 DIAGNOSIS — G4733 Obstructive sleep apnea (adult) (pediatric): Secondary | ICD-10-CM | POA: Diagnosis not present

## 2021-04-27 DIAGNOSIS — S71102A Unspecified open wound, left thigh, initial encounter: Secondary | ICD-10-CM | POA: Diagnosis not present

## 2021-04-27 DIAGNOSIS — E119 Type 2 diabetes mellitus without complications: Secondary | ICD-10-CM | POA: Diagnosis not present

## 2021-04-27 DIAGNOSIS — K5903 Drug induced constipation: Secondary | ICD-10-CM | POA: Diagnosis not present

## 2021-04-27 DIAGNOSIS — E782 Mixed hyperlipidemia: Secondary | ICD-10-CM | POA: Diagnosis not present

## 2021-04-27 DIAGNOSIS — L89309 Pressure ulcer of unspecified buttock, unspecified stage: Secondary | ICD-10-CM | POA: Diagnosis not present

## 2021-04-27 DIAGNOSIS — I959 Hypotension, unspecified: Secondary | ICD-10-CM | POA: Diagnosis not present

## 2021-04-27 DIAGNOSIS — C801 Malignant (primary) neoplasm, unspecified: Secondary | ICD-10-CM | POA: Diagnosis not present

## 2021-04-27 DIAGNOSIS — E860 Dehydration: Secondary | ICD-10-CM | POA: Diagnosis not present

## 2021-04-27 DIAGNOSIS — T83098D Other mechanical complication of other indwelling urethral catheter, subsequent encounter: Secondary | ICD-10-CM | POA: Diagnosis not present

## 2021-04-27 DIAGNOSIS — Z792 Long term (current) use of antibiotics: Secondary | ICD-10-CM | POA: Diagnosis not present

## 2021-04-27 DIAGNOSIS — T83091D Other mechanical complication of indwelling urethral catheter, subsequent encounter: Secondary | ICD-10-CM | POA: Diagnosis not present

## 2021-04-27 DIAGNOSIS — R1084 Generalized abdominal pain: Secondary | ICD-10-CM | POA: Diagnosis not present

## 2021-04-27 DIAGNOSIS — R109 Unspecified abdominal pain: Secondary | ICD-10-CM | POA: Diagnosis not present

## 2021-04-27 DIAGNOSIS — E870 Hyperosmolality and hypernatremia: Secondary | ICD-10-CM | POA: Diagnosis not present

## 2021-04-27 DIAGNOSIS — L8990 Pressure ulcer of unspecified site, unspecified stage: Secondary | ICD-10-CM | POA: Diagnosis not present

## 2021-04-27 DIAGNOSIS — G9341 Metabolic encephalopathy: Secondary | ICD-10-CM | POA: Diagnosis not present

## 2021-04-27 DIAGNOSIS — R627 Adult failure to thrive: Secondary | ICD-10-CM | POA: Diagnosis not present

## 2021-04-27 DIAGNOSIS — K567 Ileus, unspecified: Secondary | ICD-10-CM | POA: Diagnosis not present

## 2021-04-27 DIAGNOSIS — E440 Moderate protein-calorie malnutrition: Secondary | ICD-10-CM | POA: Diagnosis not present

## 2021-04-27 DIAGNOSIS — K6389 Other specified diseases of intestine: Secondary | ICD-10-CM | POA: Diagnosis not present

## 2021-04-27 DIAGNOSIS — S31839A Unspecified open wound of anus, initial encounter: Secondary | ICD-10-CM | POA: Diagnosis not present

## 2021-04-27 DIAGNOSIS — L8942 Pressure ulcer of contiguous site of back, buttock and hip, stage 2: Secondary | ICD-10-CM | POA: Diagnosis not present

## 2021-04-27 DIAGNOSIS — E876 Hypokalemia: Secondary | ICD-10-CM | POA: Diagnosis not present

## 2021-04-27 DIAGNOSIS — E871 Hypo-osmolality and hyponatremia: Secondary | ICD-10-CM | POA: Diagnosis not present

## 2021-04-27 DIAGNOSIS — Z960 Presence of urogenital implants: Secondary | ICD-10-CM | POA: Diagnosis not present

## 2021-04-27 DIAGNOSIS — R54 Age-related physical debility: Secondary | ICD-10-CM | POA: Diagnosis not present

## 2021-04-27 DIAGNOSIS — L89899 Pressure ulcer of other site, unspecified stage: Secondary | ICD-10-CM | POA: Diagnosis not present

## 2021-04-27 DIAGNOSIS — I10 Essential (primary) hypertension: Secondary | ICD-10-CM | POA: Diagnosis not present

## 2021-04-27 DIAGNOSIS — R918 Other nonspecific abnormal finding of lung field: Secondary | ICD-10-CM | POA: Diagnosis not present

## 2021-04-27 DIAGNOSIS — C7951 Secondary malignant neoplasm of bone: Secondary | ICD-10-CM | POA: Diagnosis not present

## 2021-04-27 DIAGNOSIS — R4182 Altered mental status, unspecified: Secondary | ICD-10-CM | POA: Diagnosis not present

## 2021-04-27 DIAGNOSIS — N3000 Acute cystitis without hematuria: Secondary | ICD-10-CM | POA: Diagnosis not present

## 2021-04-27 DIAGNOSIS — E039 Hypothyroidism, unspecified: Secondary | ICD-10-CM | POA: Diagnosis not present

## 2021-04-27 DIAGNOSIS — E878 Other disorders of electrolyte and fluid balance, not elsewhere classified: Secondary | ICD-10-CM | POA: Diagnosis not present

## 2021-04-27 DIAGNOSIS — Z20822 Contact with and (suspected) exposure to covid-19: Secondary | ICD-10-CM | POA: Diagnosis not present

## 2021-04-27 DIAGNOSIS — R410 Disorientation, unspecified: Secondary | ICD-10-CM | POA: Diagnosis not present

## 2021-04-27 DIAGNOSIS — N390 Urinary tract infection, site not specified: Secondary | ICD-10-CM | POA: Diagnosis not present

## 2021-04-27 DIAGNOSIS — C7802 Secondary malignant neoplasm of left lung: Secondary | ICD-10-CM | POA: Diagnosis not present

## 2021-04-27 DIAGNOSIS — Z794 Long term (current) use of insulin: Secondary | ICD-10-CM | POA: Diagnosis not present

## 2021-04-27 DIAGNOSIS — T83511A Infection and inflammatory reaction due to indwelling urethral catheter, initial encounter: Secondary | ICD-10-CM | POA: Diagnosis not present

## 2021-04-27 DIAGNOSIS — R52 Pain, unspecified: Secondary | ICD-10-CM | POA: Diagnosis not present

## 2021-04-27 DIAGNOSIS — E43 Unspecified severe protein-calorie malnutrition: Secondary | ICD-10-CM | POA: Diagnosis not present

## 2021-04-27 DIAGNOSIS — R0902 Hypoxemia: Secondary | ICD-10-CM | POA: Diagnosis not present

## 2021-04-27 DIAGNOSIS — R140 Abdominal distension (gaseous): Secondary | ICD-10-CM | POA: Diagnosis not present

## 2021-04-27 DIAGNOSIS — L89321 Pressure ulcer of left buttock, stage 1: Secondary | ICD-10-CM | POA: Diagnosis not present

## 2021-05-07 DIAGNOSIS — Z20822 Contact with and (suspected) exposure to covid-19: Secondary | ICD-10-CM | POA: Diagnosis not present

## 2021-05-17 DEATH — deceased

## 2021-05-24 DIAGNOSIS — Z20822 Contact with and (suspected) exposure to covid-19: Secondary | ICD-10-CM | POA: Diagnosis not present
